# Patient Record
Sex: Female | Born: 1955
Health system: Southern US, Community
[De-identification: ages and names within clinical notes are randomized; demographics above are authoritative.]

## PROBLEM LIST (undated history)

## (undated) DIAGNOSIS — Z8619 Personal history of other infectious and parasitic diseases: Secondary | ICD-10-CM

## (undated) DIAGNOSIS — K5792 Diverticulitis of intestine, part unspecified, without perforation or abscess without bleeding: Secondary | ICD-10-CM

## (undated) DIAGNOSIS — K219 Gastro-esophageal reflux disease without esophagitis: Secondary | ICD-10-CM

## (undated) DIAGNOSIS — Z86010 Personal history of colonic polyps: Secondary | ICD-10-CM

## (undated) DIAGNOSIS — K635 Polyp of colon: Secondary | ICD-10-CM

## (undated) DIAGNOSIS — J189 Pneumonia, unspecified organism: Secondary | ICD-10-CM

## (undated) DIAGNOSIS — T7840XA Allergy, unspecified, initial encounter: Secondary | ICD-10-CM

## (undated) DIAGNOSIS — B351 Tinea unguium: Secondary | ICD-10-CM

## (undated) DIAGNOSIS — B019 Varicella without complication: Secondary | ICD-10-CM

## (undated) HISTORY — DX: Allergy, unspecified, initial encounter: T78.40XA

## (undated) HISTORY — PX: OTHER SURGICAL HISTORY: SHX169

## (undated) HISTORY — PX: COLONOSCOPY: SHX174

## (undated) HISTORY — DX: Personal history of colonic polyps: Z86.010

## (undated) HISTORY — DX: Diverticulitis of intestine, part unspecified, without perforation or abscess without bleeding: K57.92

## (undated) HISTORY — DX: Varicella without complication: B01.9

## (undated) HISTORY — PX: BREAST BIOPSY: SHX20

## (undated) HISTORY — PX: APPENDECTOMY: SHX54

## (undated) HISTORY — DX: Polyp of colon: K63.5

## (undated) HISTORY — DX: Gastro-esophageal reflux disease without esophagitis: K21.9

## (undated) HISTORY — DX: Personal history of other infectious and parasitic diseases: Z86.19

## (undated) HISTORY — PX: ABDOMINAL HYSTERECTOMY: SHX81

---

## 1999-10-30 ENCOUNTER — Encounter: Payer: Self-pay | Admitting: Obstetrics and Gynecology

## 1999-10-30 ENCOUNTER — Encounter: Admission: RE | Admit: 1999-10-30 | Discharge: 1999-10-30 | Payer: Self-pay | Admitting: Obstetrics and Gynecology

## 1999-11-18 ENCOUNTER — Other Ambulatory Visit: Admission: RE | Admit: 1999-11-18 | Discharge: 1999-11-18 | Payer: Self-pay | Admitting: Obstetrics and Gynecology

## 2001-05-30 ENCOUNTER — Encounter: Admission: RE | Admit: 2001-05-30 | Discharge: 2001-05-30 | Payer: Self-pay | Admitting: Obstetrics and Gynecology

## 2001-05-30 ENCOUNTER — Encounter: Payer: Self-pay | Admitting: Obstetrics and Gynecology

## 2001-07-01 ENCOUNTER — Other Ambulatory Visit: Admission: RE | Admit: 2001-07-01 | Discharge: 2001-07-01 | Payer: Self-pay | Admitting: Obstetrics and Gynecology

## 2002-06-05 ENCOUNTER — Encounter: Payer: Self-pay | Admitting: Obstetrics and Gynecology

## 2002-06-05 ENCOUNTER — Encounter: Admission: RE | Admit: 2002-06-05 | Discharge: 2002-06-05 | Payer: Self-pay | Admitting: Obstetrics and Gynecology

## 2002-07-03 ENCOUNTER — Other Ambulatory Visit: Admission: RE | Admit: 2002-07-03 | Discharge: 2002-07-03 | Payer: Self-pay | Admitting: Obstetrics and Gynecology

## 2002-07-06 ENCOUNTER — Ambulatory Visit (HOSPITAL_COMMUNITY): Admission: RE | Admit: 2002-07-06 | Discharge: 2002-07-06 | Payer: Self-pay | Admitting: Obstetrics and Gynecology

## 2002-07-06 ENCOUNTER — Encounter: Payer: Self-pay | Admitting: Obstetrics and Gynecology

## 2003-06-08 ENCOUNTER — Encounter: Admission: RE | Admit: 2003-06-08 | Discharge: 2003-06-08 | Payer: Self-pay | Admitting: Obstetrics and Gynecology

## 2003-06-08 ENCOUNTER — Encounter: Payer: Self-pay | Admitting: Obstetrics and Gynecology

## 2003-09-17 ENCOUNTER — Other Ambulatory Visit: Admission: RE | Admit: 2003-09-17 | Discharge: 2003-09-17 | Payer: Self-pay | Admitting: Obstetrics and Gynecology

## 2004-06-09 ENCOUNTER — Encounter: Admission: RE | Admit: 2004-06-09 | Discharge: 2004-06-09 | Payer: Self-pay | Admitting: Obstetrics and Gynecology

## 2004-10-08 ENCOUNTER — Other Ambulatory Visit: Admission: RE | Admit: 2004-10-08 | Discharge: 2004-10-08 | Payer: Self-pay | Admitting: Obstetrics and Gynecology

## 2004-10-14 ENCOUNTER — Encounter: Admission: RE | Admit: 2004-10-14 | Discharge: 2004-10-14 | Payer: Self-pay | Admitting: Obstetrics and Gynecology

## 2005-06-19 ENCOUNTER — Encounter: Admission: RE | Admit: 2005-06-19 | Discharge: 2005-06-19 | Payer: Self-pay | Admitting: Obstetrics and Gynecology

## 2005-06-26 ENCOUNTER — Encounter: Admission: RE | Admit: 2005-06-26 | Discharge: 2005-06-26 | Payer: Self-pay | Admitting: Obstetrics and Gynecology

## 2005-07-06 ENCOUNTER — Encounter: Admission: RE | Admit: 2005-07-06 | Discharge: 2005-07-06 | Payer: Self-pay | Admitting: Obstetrics and Gynecology

## 2005-10-12 ENCOUNTER — Other Ambulatory Visit: Admission: RE | Admit: 2005-10-12 | Discharge: 2005-10-12 | Payer: Self-pay | Admitting: Obstetrics and Gynecology

## 2005-10-21 ENCOUNTER — Encounter: Admission: RE | Admit: 2005-10-21 | Discharge: 2005-10-21 | Payer: Self-pay | Admitting: Obstetrics and Gynecology

## 2006-07-29 ENCOUNTER — Encounter: Admission: RE | Admit: 2006-07-29 | Discharge: 2006-07-29 | Payer: Self-pay | Admitting: Obstetrics and Gynecology

## 2006-08-13 ENCOUNTER — Encounter: Admission: RE | Admit: 2006-08-13 | Discharge: 2006-08-13 | Payer: Self-pay | Admitting: Obstetrics and Gynecology

## 2006-08-16 ENCOUNTER — Encounter (INDEPENDENT_AMBULATORY_CARE_PROVIDER_SITE_OTHER): Payer: Self-pay | Admitting: *Deleted

## 2006-08-16 ENCOUNTER — Encounter: Admission: RE | Admit: 2006-08-16 | Discharge: 2006-08-16 | Payer: Self-pay | Admitting: Obstetrics and Gynecology

## 2006-09-10 ENCOUNTER — Encounter: Admission: RE | Admit: 2006-09-10 | Discharge: 2006-09-10 | Payer: Self-pay | Admitting: Obstetrics and Gynecology

## 2006-09-10 ENCOUNTER — Encounter (INDEPENDENT_AMBULATORY_CARE_PROVIDER_SITE_OTHER): Payer: Self-pay | Admitting: *Deleted

## 2006-09-10 HISTORY — PX: BREAST BIOPSY: SHX20

## 2007-01-31 ENCOUNTER — Encounter: Admission: RE | Admit: 2007-01-31 | Discharge: 2007-01-31 | Payer: Self-pay | Admitting: Obstetrics and Gynecology

## 2007-08-02 ENCOUNTER — Ambulatory Visit: Payer: Self-pay | Admitting: Internal Medicine

## 2007-08-03 ENCOUNTER — Encounter: Admission: RE | Admit: 2007-08-03 | Discharge: 2007-08-03 | Payer: Self-pay | Admitting: Obstetrics and Gynecology

## 2007-08-11 ENCOUNTER — Ambulatory Visit: Payer: Self-pay | Admitting: Internal Medicine

## 2007-08-11 ENCOUNTER — Encounter: Payer: Self-pay | Admitting: Internal Medicine

## 2007-08-19 ENCOUNTER — Encounter: Admission: RE | Admit: 2007-08-19 | Discharge: 2007-08-19 | Payer: Self-pay | Admitting: Obstetrics and Gynecology

## 2007-12-29 ENCOUNTER — Ambulatory Visit (HOSPITAL_COMMUNITY): Admission: RE | Admit: 2007-12-29 | Discharge: 2007-12-29 | Payer: Self-pay | Admitting: Obstetrics and Gynecology

## 2008-03-28 ENCOUNTER — Inpatient Hospital Stay (HOSPITAL_COMMUNITY): Admission: RE | Admit: 2008-03-28 | Discharge: 2008-03-30 | Payer: Self-pay | Admitting: Obstetrics and Gynecology

## 2008-03-28 ENCOUNTER — Encounter (INDEPENDENT_AMBULATORY_CARE_PROVIDER_SITE_OTHER): Payer: Self-pay | Admitting: Obstetrics and Gynecology

## 2008-08-20 ENCOUNTER — Encounter: Admission: RE | Admit: 2008-08-20 | Discharge: 2008-08-20 | Payer: Self-pay | Admitting: Obstetrics and Gynecology

## 2009-09-02 ENCOUNTER — Encounter: Admission: RE | Admit: 2009-09-02 | Discharge: 2009-09-02 | Payer: Self-pay | Admitting: Obstetrics and Gynecology

## 2010-09-08 ENCOUNTER — Encounter: Admission: RE | Admit: 2010-09-08 | Discharge: 2010-09-08 | Payer: Self-pay | Admitting: Obstetrics and Gynecology

## 2010-12-20 ENCOUNTER — Encounter: Payer: Self-pay | Admitting: Obstetrics and Gynecology

## 2010-12-21 ENCOUNTER — Encounter: Payer: Self-pay | Admitting: Obstetrics and Gynecology

## 2011-04-14 NOTE — Op Note (Signed)
[name], [name]                 ACCOUNT NO.:  [id number]   MEDICAL RECORD NO.:  [id number]          PATIENT TYPE:  INP   LOCATION:  9317                          FACILITY:  [facility]   PHYSICIAN:  Huel Cote, M.D. DATE OF BIRTH:  [birth year]   DATE OF PROCEDURE:  03/28/2008  DATE OF DISCHARGE:                               OPERATIVE REPORT   PREOPERATIVE DIAGNOSES:  1. Fibroids.  2. Pelvic pain.  3. Dyspareunia.   POSTOPERATIVE DIAGNOSES:  1. Fibroids.  2. Pelvic pain.  3. Dyspareunia.  4. Pedunculated fibroid intimately associated with the right ovary.   PROCEDURE:  Total abdominal hysterectomy, bilateral salpingo-  oophorectomy.   SURGEON:  Huel Cote, M.D.   ASSISTANT:  Malachi Pro. Ambrose Mantle, M.D.   ANESTHESIA:  General.   FINDINGS:  Frozen was performed secondary to an atypical-appearing  exophytic fibroid, which was intimately associated with the right ovary  and could not be fully differentiated from the ovary.  This was sent and  confirmed by frozen to be an exophytic fibroid with no atypia seen and  the right ovarian tissue appeared normal.  The uterus itself was  approximately 14-16 weeks in size with multiple fibroids noted  throughout.  The left ovary and tube appeared normal.  The abdomen and  pelvis also appeared normal.  Estimated blood loss was 200 mL.  Urine  output 350 mL clear urine.  IV fluids was 2300 mL LR.   OPERATIVE PROCEDURE:  The patient was taken to the operating room where  a general anesthesia was obtained without difficulty.  She was then  prepped and draped in normal sterile fashion in the dorsal supine  position.  A Pfannenstiel skin incision was then made with the Foley  catheter in place and carried through to the underlying layer of fascia  by sharp dissection and Bovie cautery.  Fascia was then opened in the  midline and extended laterally with Mayo scissors.  The inferior aspect  was grasped with Kocher clamps, elevated and  dissected off the rectus  muscles superiorly.  The rectus muscles were dissected off the fascia in  a similar fashion.  The peritoneal cavity itself was entered bluntly and  the peritoneal incision extended both superiorly and inferiorly with  careful attention to avoid both bowel and bladder.  The Alexis self-  retaining large wound retractor was then placed within the incision, and  the patient's bowel packed with moist laparotomy sponges.  The pelvis  and ovaries were inspected carefully with the findings as previously  stated.  As indicated, the right adnexa appeared abnormal with this  intimately associated solid mass so adjacent to the right ovary that it  appeared that they could be confluent.  For this reason, it was decided  that we would perform a frozen section, and I confirmed that this was  indeed simply an exophytic fibroid.  The uterus was grasped at each  cornua with a long Kelly, and the right round ligament was taken down  with Bovie cautery.  The retroperitoneal space was opened slightly and a  window created in the  broad ligament.  The infundibulopelvic ligament  was then identified, grasped with a parametrial clamp and transected  with 2 suture ligatures of 0 Vicryl pop-off placed to secure the pedicle  and no active bleeding noted.  At this point, the utero-ovarian ligament  and stalk that was on this mass was taken in one clamp with a  parametrial clamp, and therefore, the right adnexa in entirety was  amputated with the mass and sent for frozen section.  The utero-ovarian  pedicle was tied off with a 0 Vicryl tie.  Attention was then turned to  the remainder of the uterus.  The left round ligament was then taken  down with Bovie cautery, and likewise, a window made in the broad  ligament.  The infundibulopelvic ligament was taken down with  parametrial clamp and secured with 2 suture ligatures of 0 Vicryl.  At  this point, the further broad ligament was taken down  easily with Bovie  cautery down to the level of the uterine vessels, which were  skeletonized.  A bladder flap was created along the anterior surface of  the cervix and the bladder pushed away easily.  Attention was then  returned to the patient's right where in a similar fashion, the uterine  arteries were exposed and the bladder flap met in the midline.  The  uterine arteries were then taken down bilaterally with a parametrial  clamp and secured with 2 suture ligatures of 0 Vicryl at each step.  Thus, the uterine artery secured, the remainder of the paracervical and  uterosacral ligaments were taken down sequentially with parametrial  clamps, taking the pedicle and 0 pop-off suture ligature utilized to  suture-ligate each area.  The uterosacral ligaments were held in clamps,  and the remainder of sutures cut at the level of the external cervical  os.  The right angle parametrium was taken across the vaginal cuff and  the uterus and cervix were amputated from the vaginal cuff and handed  off to pathology.  The cuff was then secured with 0 Vicryl pop-offs at  each angle and with several other figure-of-8 sutures along the midline  for good hemostasis.  At this point, all appeared hemostatic.  The  abdomen and pelvis were irrigated and no significant active bleeding.  Small areas of oozing were treated with Bovie cautery.  The  infundibulopelvic and round ligaments appeared dry.  The ureters were  then inspected bilaterally and found to be of normal caliber and away  from the surgical field.  At this point, the uterosacral ligaments were  then reapproximated with a suture of 0 Vicryl for added uterosacral  support.  The peritoneum was then reperitonealized just over the vaginal  cuff area, and again, the pelvis irrigated with no active bleeding  noted.  At this point, all instruments and sponges were then removed  from the patient's abdomen, and the patient had a correct sponge count.  The  subfascial planes and rectus muscle were inspected with any areas of  bleeding treated with Bovie cautery.  The rectus muscle was  reapproximated very nicely with no gaping noted, and therefore, the  fascia was closed primarily with a 0 Vicryl in a running fashion.  Subcutaneous tissue was reapproximated with a running suture of 0  Vicryl, and the skin was closed with staples.  Sponge, lap, and needle  counts were correct again x2, and the patient was awakened and then  taken to the recovery room in stable condition.  Huel Cote, M.D.  Electronically Signed     KR/MEDQ  D:  03/28/2008  T:  03/29/2008  Job:  [id number]

## 2011-04-14 NOTE — H&P (Signed)
NAME:  Crystal [name], TRINKA                 ACCOUNT NO.:  [id number]   MEDICAL RECORD NO.:  [id number]          PATIENT TYPE:  AMB   LOCATION:  SDC                           FACILITY:  [facility]   PHYSICIAN:  Huel Cote, M.D. DATE OF BIRTH:  October 31, 1956   DATE OF ADMISSION:  DATE OF DISCHARGE:                              HISTORY & PHYSICAL   Surgery to take place at the [facility] facility at 9 o'clock a.m.   The patient is a 55 year old nulligravida female who is coming in for a  scheduled total abdominal hysterectomy and bilateral salpingo-  oophorectomy for a fibroid uterus, which has caused some increasing  symptoms with dyspareunia and pain, especially on her right side.  She  also feels that there is some excessive pressure on that side.  She has  had radiological studies performed with an ultrasound in January 2009,  which revealed a large fibroid uterus and a large solid mass extending  off the right side of the uterus.  As it could not be definitely  determined whether this was uterine or ovarian in origin, she had an MRI  to confirm, and it did appear to reflect an exophytic fibroid in  consistency.  The right ovary could not be clearly identified  separately, therefore an ovarian process could not be completely  excluded.  However, it was not felt likely by either radiological study.  There was no other ascites or other findings, and it was felt most  likely this was fibroid in nature.  The patient expressed a desire  secondary to her symptoms and the possibility that there could be an  adnexal process to proceed with a hysterectomy.   PAST MEDICAL HISTORY:  Insignificant.   PAST SURGICAL HISTORY:  She had a tubal ligation, oral surgery and a  second surgery unblock her tubes.   PAST OBSTETRICAL HISTORY:  None.   PAST GYN HISTORY:  1. No abnormal Pap smears.  2. History of fibroids.   FAMILY HISTORY:  Significant with breast cancer in her two sisters, both  at the age  of 75 years old.  She does not have any heart disease or  colon cancer in the family.   Mammogram recently was normal in the last year.   MEDICATIONS:  Include only calcium and vitamin D.   ALLERGIES:  SHE HAS NO KNOWN DRUG ALLERGIES.   PHYSICAL EXAMINATION:  VITAL SIGNS:  Her weight is 145 pounds.  Blood  pressure 100/70.  BREASTS:  Normal.  CARDIAC:  Regular rate and rhythm.  LUNGS:  Clear.  ABDOMEN:  Soft and nontender.  PELVIC:  She has a 14-16 weeks size and mobile uterus.  Ovaries were not  palpable secondary to the bulky nature of the uterus.  The vagina has  some atrophic change.  Cervix has no lesions.   The patient was counseled as to the risks and benefits of proceeding  with surgery, including bleeding, infection and possible damage to bowel  and bladder.  We did discuss performing a laparoscopic supracervical  hysterectomy.  However, the patient declined this surgery and wishes  instead to proceed with an abdominal surgery.  She wishes to have both  ovaries removed at the time of surgery and should any an usual pathology  arise secondary to the right adnexa, wishes to have any necessary  procedures performed at the time of surgery.  The patient understands  all risks and benefits and possible delay in recovery should any  complication arise and desires to proceed with surgery      Huel Cote, M.D.  Electronically Signed     KR/MEDQ  D:  03/27/2008  T:  03/27/2008  Job:  [id number]

## 2011-04-17 NOTE — Discharge Summary (Signed)
NAMEIVRY, PIGUE                 ACCOUNT NO.:  [id number]   MEDICAL RECORD NO.:  [id number]          PATIENT TYPE:  INP   LOCATION:  9317                          FACILITY:  [facility]   PHYSICIAN:  Huel Cote, M.D. DATE OF BIRTH:  [birth year]   DATE OF ADMISSION:  03/28/2008  DATE OF DISCHARGE:  03/30/2008                               DISCHARGE SUMMARY   DISCHARGE DIAGNOSES:  1. Fibroids, pelvic pain, and dyspareunia.  2. Status post total abdominal hysterectomy.  3. Pedunculated fibroid intimately associated with the right ovary      with frozen pathology benign.   DISCHARGE MEDICATIONS:  1. Motrin 600 mg p.o. every 6 hours p.r.n.  2. Percocet 1-2 tablets p.o. every 4 hours p.r.n.   FOLLOWUP:  The patient is to follow up in the office in several days for  staple removal.   HOSPITAL COURSE:  The patient is a 55 year old female who came in for a  scheduled total abdominal hysterectomy and bilateral salpingo-  oophorectomy secondary to fibroids and pelvic pain associated with them.  She also had a probable pedunculated fibroid, which was intimately  associated with her right ovary, which had required an MRI to ensure  that it was likely not a solid mass of the ovary; however, given the  patient's symptoms and the inability to 100% assess this, she elected to  have a hysterectomy.  She underwent a total abdominal hysterectomy as  scheduled and was found to have a 14- to- 16-week size uterus with  multiple fibroids noted.  Left ovary and tube were normal.  The right  ovary actually had a very large exophytic fibroid, which was adherent to  it and made it difficult to confirm that it was nonovarian in origin,  therefore this was sent for frozen pathology at the time of hysterectomy  and was thought to be consistent with exophytic fibroid versus solid  ovarian tumor.  Final pathology also confirmed this.  The patient was  then admitted for routine postoperative care.  On postop day  #1, she was  doing well.  She had some soreness, and her hemoglobin was noted to be  11.3.  Later in that day secondary to some increase in her pain, she  received Dilaudid dose with 12.5 of Phenergan.  She became very obtunded  from this dose and did require Narcan, however, she never became  completely unresponsive.  She did get quite out of it and her mental  status was not as clear.  She was observed very closely and anesthesia  actually saw the patient who felt that she was having a reaction to the  Phenergan, more so than Dilaudid and this resolved over several hours as  the medication wore off.  The patient on postop day #2 felt extremely  better.  She had no nausea or vomiting and her pain was controlled.  Her  incision was clear.  Her staples were intact and she was to return in  the office and have those removed.  As she was tolerating regular diet  and doing well, she was felt stable  for discharge home.      Huel Cote, M.D.  Electronically Signed     KR/MEDQ  D:  04/19/2008  T:  04/19/2008  Job:  [id number]

## 2011-08-04 ENCOUNTER — Other Ambulatory Visit: Payer: Self-pay | Admitting: Obstetrics and Gynecology

## 2011-08-04 DIAGNOSIS — Z1231 Encounter for screening mammogram for malignant neoplasm of breast: Secondary | ICD-10-CM

## 2011-08-04 DIAGNOSIS — M8580 Other specified disorders of bone density and structure, unspecified site: Secondary | ICD-10-CM

## 2011-08-25 LAB — CBC
HCT: 32.5 — ABNORMAL LOW
HCT: 40.5
Hemoglobin: 11.3 — ABNORMAL LOW
Hemoglobin: 13.9
MCV: 87.5
MCV: 87.6
Platelets: 253
Platelets: 355
RBC: 4.63
RDW: 14.5
WBC: 4.6

## 2011-08-25 LAB — BASIC METABOLIC PANEL
BUN: 13
Chloride: 104
GFR calc Af Amer: >60
GFR calc non Af Amer: >60
Potassium: 3.8
Sodium: 140

## 2011-09-10 ENCOUNTER — Ambulatory Visit
Admission: RE | Admit: 2011-09-10 | Discharge: 2011-09-10 | Disposition: A | Discharge status: Home / Self Care | Payer: 59 | Source: Ambulatory Visit | Attending: Obstetrics and Gynecology | Admitting: Obstetrics and Gynecology

## 2011-09-10 DIAGNOSIS — M8580 Other specified disorders of bone density and structure, unspecified site: Secondary | ICD-10-CM

## 2011-09-10 DIAGNOSIS — Z1231 Encounter for screening mammogram for malignant neoplasm of breast: Secondary | ICD-10-CM

## 2012-07-27 ENCOUNTER — Emergency Department (INDEPENDENT_AMBULATORY_CARE_PROVIDER_SITE_OTHER): Payer: 59

## 2012-07-27 ENCOUNTER — Encounter: Payer: Self-pay | Admitting: *Deleted

## 2012-07-27 ENCOUNTER — Emergency Department
Admission: EM | Admit: 2012-07-27 | Discharge: 2012-07-27 | Disposition: A | Discharge status: Home / Self Care | Payer: 59 | Source: Home / Self Care | Attending: Family Medicine | Admitting: Family Medicine

## 2012-07-27 DIAGNOSIS — D72819 Decreased white blood cell count, unspecified: Secondary | ICD-10-CM

## 2012-07-27 DIAGNOSIS — R52 Pain, unspecified: Secondary | ICD-10-CM

## 2012-07-27 DIAGNOSIS — J189 Pneumonia, unspecified organism: Secondary | ICD-10-CM

## 2012-07-27 DIAGNOSIS — R918 Other nonspecific abnormal finding of lung field: Secondary | ICD-10-CM

## 2012-07-27 LAB — POCT CBC W AUTO DIFF (K'VILLE URGENT CARE)

## 2012-07-27 LAB — POCT INFLUENZA A/B
Influenza A, POC: NEGATIVE
Influenza B, POC: NEGATIVE

## 2012-07-27 NOTE — ED Notes (Signed)
Pt c/o cough, chest congestion, and nasal congestion x 4 days. She also c/o  chills, body aches, and fever x today. She has taken zyrtec and aleve. She has a hx of pneumonia in 1/13'.

## 2012-07-27 NOTE — ED Notes (Signed)
Called and spoke to charge nurse Amy at [facility] center given report and faxed pts records to her.

## 2012-07-27 NOTE — ED Provider Notes (Signed)
History     CSN: [id number]  Arrival date & time 07/27/12  1536   First MD Initiated Contact with Patient 07/27/12 1608      Chief Complaint  Patient presents with  . Fever  . Generalized Body Aches  . Chills      HPI Comments: Patient complains of approximately 5 day history of gradually progressive URI symptoms beginning with   nasal congestion followed by a cough the next day.  No sore throat.  Complains of fatigue and myalgias.  Cough is now worse at night and generally non-productive during the day.  She became worse today with shortness of breath with activity and tightness in her anterior chest.  No wheezing.  She developed chills and shakes at 11:30 this morning. She has a history of pneumonia in January, 2013.  She subsequently had a pneumococcal vaccination.   The history is provided by the patient and the spouse.    History reviewed. No pertinent past medical history.  Past Surgical History  Procedure Date  . Abdominal hysterectomy   . Dental implant     Family History  Problem Relation Age of Onset  . Heart failure Mother   . Cancer Father     pancreatic  . Cancer Sister     breast    History  Substance Use Topics  . Smoking status: Never Smoker   . Smokeless tobacco: Not on file  . Alcohol Use: Yes    OB History    Grav Para Term Preterm Abortions TAB SAB Ect Mult Living                  Review of Systems No sore throat + cough ? pleuritic pain No wheezing + nasal congestion + post-nasal drainage No sinus pain/pressure No itchy/red eyes No earache No hemoptysis + SOB + fever, + chills No nausea No vomiting No abdominal pain No diarrhea No urinary symptoms No skin rashes + fatigue + myalgias + headache Used OTC meds without relief  Allergies  Other  Home Medications  No current outpatient prescriptions on file.  BP 100/64  Pulse 114  Temp 102.5 F (39.2 C)  Resp 20  Ht 5\' 2"  (1.575 m)  Wt 151 lb 12 oz (68.833 kg)  BMI  27.76 kg/m2  SpO2 94%  Physical Exam Nursing notes and Vital Signs reviewed. Appearance:  Patient appears healthy, stated age, and in no acute distress Eyes:  Pupils are equal, round, and reactive to light and accomodation.  Extraocular movement is intact.  Conjunctivae are not inflamed  Ears:  Canals normal.  Tympanic membranes normal.  Nose:  Mildly congested turbinates.  No sinus tenderness.  Mouth:  moist mucous membranes   Pharynx:  Normal Neck:  Supple.  Tender shotty anterior/posterior nodes are palpated bilaterally  Lungs:  Clear to auscultation.  Breath sounds are equal. No respiratory distress. Chest:  Distinct tenderness to palpation over the mid-sternum.  Heart:  Regular rate and rhythm without murmurs, rubs, or gallops.  Abdomen:  Nontender without masses or hepatosplenomegaly.  Bowel sounds are present.  No CVA or flank tenderness.  Extremities:  No edema.  No calf tenderness Skin:  No rash present.   ED Course  Procedures  none   Labs Reviewed  POCT CBC W AUTO DIFF ([facility])   WBC 1.0; LY 16.5; MO 1.6; GR 81.9; Hgb 12.2; Platelets 240   POCT INFLUENZA A/B negative   Dg Chest 2 View  07/27/2012  *RADIOLOGY REPORT*  Clinical Data: Fever generalized body aches.  CHEST - 2 VIEW  Comparison: None available.  Findings: The heart size is normal.  Ill-defined lower lobe airspace disease is best seen on the lateral view.  The upper lung fields are clear.  Mild degenerative changes are noted in the thoracic spine.  IMPRESSION: Lower lobe airspace disease is best visualized on the lateral view. While this may represent atelectasis, raises concern for early pneumonia.   Original Report Authenticated By: Jamesetta Orleans. MATTERN, M.D.      1. Pneumonia   2. Leukopenia       MDM  With likely bilateral early pneumonia on chest X-ray and leukopenia (WBC 1.0), will refer to [facility] ER for possible admission.        Lattie Haw, MD 07/27/12  [phone]

## 2012-08-05 ENCOUNTER — Ambulatory Visit
Admission: RE | Admit: 2012-08-05 | Discharge: 2012-08-05 | Disposition: A | Discharge status: Home / Self Care | Payer: 59 | Source: Ambulatory Visit | Attending: Family Medicine | Admitting: Family Medicine

## 2012-08-05 ENCOUNTER — Other Ambulatory Visit: Payer: Self-pay | Admitting: Family Medicine

## 2012-08-05 VITALS — BP 100/62 | HR 71

## 2012-08-05 DIAGNOSIS — G971 Other reaction to spinal and lumbar puncture: Secondary | ICD-10-CM

## 2012-08-05 MED ORDER — IOHEXOL 180 MG/ML  SOLN
1.0000 mL | Freq: Once | INTRAMUSCULAR | Status: AC | PRN
  Administered 2012-08-05: 1 mL via EPIDURAL

## 2012-08-05 NOTE — Progress Notes (Signed)
20cc blood drawn for procedure from right AC space without difficulty; site unremarkable.  jkl 

## 2012-08-08 ENCOUNTER — Other Ambulatory Visit: Payer: Self-pay | Admitting: Obstetrics and Gynecology

## 2012-08-08 DIAGNOSIS — Z1231 Encounter for screening mammogram for malignant neoplasm of breast: Secondary | ICD-10-CM

## 2012-09-12 ENCOUNTER — Ambulatory Visit
Admission: RE | Admit: 2012-09-12 | Discharge: 2012-09-12 | Disposition: A | Discharge status: Home / Self Care | Payer: 59 | Source: Ambulatory Visit | Attending: Obstetrics and Gynecology | Admitting: Obstetrics and Gynecology

## 2012-09-12 DIAGNOSIS — Z1231 Encounter for screening mammogram for malignant neoplasm of breast: Secondary | ICD-10-CM

## 2012-09-16 ENCOUNTER — Other Ambulatory Visit: Payer: Self-pay | Admitting: Obstetrics and Gynecology

## 2012-09-16 DIAGNOSIS — R928 Other abnormal and inconclusive findings on diagnostic imaging of breast: Secondary | ICD-10-CM

## 2012-09-20 ENCOUNTER — Ambulatory Visit
Admission: RE | Admit: 2012-09-20 | Discharge: 2012-09-20 | Disposition: A | Discharge status: Home / Self Care | Payer: 59 | Source: Ambulatory Visit | Attending: Obstetrics and Gynecology | Admitting: Obstetrics and Gynecology

## 2012-09-20 ENCOUNTER — Other Ambulatory Visit: Payer: Self-pay | Admitting: Obstetrics and Gynecology

## 2012-09-20 DIAGNOSIS — R928 Other abnormal and inconclusive findings on diagnostic imaging of breast: Secondary | ICD-10-CM

## 2012-09-30 ENCOUNTER — Other Ambulatory Visit: Payer: Self-pay | Admitting: Obstetrics and Gynecology

## 2012-09-30 ENCOUNTER — Ambulatory Visit
Admission: RE | Admit: 2012-09-30 | Discharge: 2012-09-30 | Disposition: A | Discharge status: Home / Self Care | Payer: 59 | Source: Ambulatory Visit | Attending: Obstetrics and Gynecology | Admitting: Obstetrics and Gynecology

## 2012-09-30 DIAGNOSIS — R928 Other abnormal and inconclusive findings on diagnostic imaging of breast: Secondary | ICD-10-CM

## 2012-11-14 ENCOUNTER — Encounter: Payer: Self-pay | Admitting: *Deleted

## 2012-11-14 ENCOUNTER — Emergency Department (INDEPENDENT_AMBULATORY_CARE_PROVIDER_SITE_OTHER): Payer: 59

## 2012-11-14 ENCOUNTER — Emergency Department
Admission: EM | Admit: 2012-11-14 | Discharge: 2012-11-14 | Disposition: A | Discharge status: Home / Self Care | Payer: 59 | Source: Home / Self Care | Attending: Family Medicine | Admitting: Family Medicine

## 2012-11-14 DIAGNOSIS — R05 Cough: Secondary | ICD-10-CM

## 2012-11-14 DIAGNOSIS — R059 Cough, unspecified: Secondary | ICD-10-CM

## 2012-11-14 DIAGNOSIS — J069 Acute upper respiratory infection, unspecified: Secondary | ICD-10-CM

## 2012-11-14 DIAGNOSIS — D72819 Decreased white blood cell count, unspecified: Secondary | ICD-10-CM

## 2012-11-14 DIAGNOSIS — R509 Fever, unspecified: Secondary | ICD-10-CM

## 2012-11-14 DIAGNOSIS — J8410 Pulmonary fibrosis, unspecified: Secondary | ICD-10-CM

## 2012-11-14 LAB — POCT CBC W AUTO DIFF (K'VILLE URGENT CARE)

## 2012-11-14 LAB — POCT INFLUENZA A/B: Influenza A, POC: NEGATIVE

## 2012-11-14 MED ORDER — CEFTRIAXONE SODIUM 1 G IJ SOLR
1.0000 g | Freq: Once | INTRAMUSCULAR | Status: AC
  Administered 2012-11-14: 1 g via INTRAMUSCULAR

## 2012-11-14 MED ORDER — ACETAMINOPHEN 325 MG PO TABS
650.0000 mg | ORAL_TABLET | Freq: Once | ORAL | Status: AC
  Administered 2012-11-14: 650 mg via ORAL

## 2012-11-14 MED ORDER — ONDANSETRON HCL 4 MG PO TABS: 4.0000 mg | ORAL_TABLET | Freq: Four times a day (QID) | ORAL | Status: DC

## 2012-11-14 MED ORDER — ONDANSETRON 4 MG PO TBDP
4.0000 mg | ORAL_TABLET | Freq: Once | ORAL | Status: AC
  Administered 2012-11-14: 4 mg via ORAL

## 2012-11-14 MED ORDER — CEFDINIR 300 MG PO CAPS: 300.0000 mg | ORAL_CAPSULE | Freq: Two times a day (BID) | ORAL | Status: DC

## 2012-11-14 MED ORDER — BENZONATATE 200 MG PO CAPS: 200.0000 mg | ORAL_CAPSULE | Freq: Every day | ORAL | Status: DC

## 2012-11-14 NOTE — ED Provider Notes (Signed)
History     CSN: [id number]  Arrival date & time 11/14/12  1711   First MD Initiated Contact with Patient 11/14/12 1738      Chief Complaint  Patient presents with  . Emesis  . Fever  . Generalized Body Aches  . Chills      HPI Comments: Patient complains of 3 day history of flu-like symptoms beginning with a mild sore throat (now improved), fatigue, myalgias, headache, chills, sinus congestion, and a mild cough.  This afternoon she developed nausea and had several episodes of vomiting.  Cough is generally non-productive. There has been no pleuritic pain, shortness of breath, or wheezes but she does have a sensation of tightness in her anterior chest.  She has had a seasonal flu shot and pneumococcal vaccine.  She has had two episodes of pneumonia this year, most recently in August when she was hospitalized for a week.  The history is provided by the patient and the spouse.    History reviewed.  Two episodes pneumonia this year.  Past Surgical History  Procedure Date  . Abdominal hysterectomy   . Dental implant     Family History  Problem Relation Age of Onset  . Heart failure Mother   . Cancer Father     pancreatic  . Cancer Sister     breast    History  Substance Use Topics  . Smoking status: Never Smoker   . Smokeless tobacco: Not on file  . Alcohol Use: Yes    OB History    Grav Para Term Preterm Abortions TAB SAB Ect Mult Living                  Review of Systems + sore throat + cough No pleuritic pain No wheezing + nasal congestion + post-nasal drainage No sinus pain/pressure No itchy/red eyes No earache No hemoptysis No SOB + fever, + chills + nausea + vomiting, resolved No abdominal pain No diarrhea No urinary symptoms No skin rashes + fatigue + myalgias + headache Used OTC meds without relief  Allergies  Demerol and Phenergan  Home Medications   Current Outpatient Rx  Name  Route  Sig  Dispense  Refill  . BENZONATATE 200 MG  PO CAPS   Oral   Take 1 capsule (200 mg total) by mouth at bedtime. Take as needed for cough   12 capsule   0   . CEFDINIR 300 MG PO CAPS   Oral   Take 1 capsule (300 mg total) by mouth 2 (two) times daily.   20 capsule   0   . ONDANSETRON HCL 4 MG PO TABS   Oral   Take 1 tablet (4 mg total) by mouth every 6 (six) hours. as needed for nausea   12 tablet   0     BP 105/66  Pulse 95  Temp 102 F (38.9 C) (Tympanic)  Resp 18  Wt 155 lb (70.308 kg)  SpO2 96%  Physical Exam Nursing notes and Vital Signs reviewed. Appearance:  Patient appears stated age, and in no acute distress Eyes:  Pupils are equal, round, and reactive to light and accomodation.  Extraocular movement is intact.  Conjunctivae are not inflamed  Ears:  Canals normal.  Tympanic membranes normal.  Nose:  Mildly congested turbinates.  No sinus tenderness.   Pharynx:  Normal Neck:  Supple.  Slightly tender shotty posterior nodes are palpated bilaterally  Lungs:  Clear to auscultation.  Breath sounds are equal.  Chest:  Distinct tenderness to palpation over the mid-sternum.  Heart:  Regular rate and rhythm without murmurs, rubs, or gallops.  Abdomen:  Nontender without masses or hepatosplenomegaly.  Bowel sounds are present.  No CVA or flank tenderness.  Extremities:  No edema.  No calf tenderness Skin:  No rash present.   ED Course  Procedures none   Labs Reviewed  POCT INFLUENZA A/B negative  POCT CBC W AUTO DIFF ([facility]):  WBC 2.5; LY 13.7; MO 5.6; GR 80.7; Hgb 12.9; Platelets 265    Dg Chest 2 View  11/14/2012  *RADIOLOGY REPORT*  Clinical Data: Cough and fever.  CHEST - 2 VIEW  Comparison: None.  Findings: Normal sized heart.  Clear lungs.  The interstitial markings are mildly prominent.  Thoracic spine degenerative changes.  IMPRESSION: Mild interstitial lung disease, most likely chronic.   Original Report Authenticated By: Beckie Salts, M.D.      1. Acute upper respiratory infections  of unspecified site   2. Leukopenia       MDM  Chart reviewed; with a history of pneumonia twice this year (last episode August), and leukopenia will give Rocephin 1mg  IM.  Zofran 4mg  ODT for nausea  Tomorrow begin Omnicef 300mg .  Rx for Zofran.  Prescription written for Benzonatate [facility]) to take at bedtime for night-time cough.  Take Mucinex D (guaifenesin with decongestant), or plain Mucinex, twice daily for congestion.  Increase fluid intake, rest. May use Afrin nasal spray (or generic oxymetazoline) twice daily for about 5 days.  Also recommend using saline nasal spray several times daily and saline nasal irrigation (AYR is a common brand) Stop all antihistamines for now, and other non-prescription cough/cold preparations. May take Ibuprofen 200mg , 4 tabs every 8 hours with food for fever, body aches, headache, etc. Followup with Family Doctor in 3 days.  Return earlier for worsening symptoms.        Lattie Haw, MD 11/14/12 [phone]

## 2012-11-14 NOTE — ED Notes (Signed)
Pt c/o chills, fever, body aches, HA and vomiting x 2 hours ago. She reports having pneumonia x 2 this year. She also c/o hoarseness and cough x 3 days.

## 2012-11-18 ENCOUNTER — Telehealth: Payer: Self-pay | Admitting: *Deleted

## 2012-12-06 ENCOUNTER — Encounter (HOSPITAL_COMMUNITY): Payer: Self-pay | Admitting: *Deleted

## 2012-12-06 ENCOUNTER — Inpatient Hospital Stay (HOSPITAL_COMMUNITY)
Admission: EM | Admit: 2012-12-06 | Discharge: 2012-12-10 | DRG: 193 | Disposition: A | Discharge status: Home / Self Care | Payer: 59 | Attending: Family Medicine | Admitting: Family Medicine

## 2012-12-06 DIAGNOSIS — R109 Unspecified abdominal pain: Secondary | ICD-10-CM

## 2012-12-06 DIAGNOSIS — R509 Fever, unspecified: Secondary | ICD-10-CM

## 2012-12-06 DIAGNOSIS — J9600 Acute respiratory failure, unspecified whether with hypoxia or hypercapnia: Secondary | ICD-10-CM | POA: Diagnosis present

## 2012-12-06 DIAGNOSIS — I959 Hypotension, unspecified: Secondary | ICD-10-CM | POA: Diagnosis present

## 2012-12-06 DIAGNOSIS — J189 Pneumonia, unspecified organism: Principal | ICD-10-CM | POA: Diagnosis present

## 2012-12-06 DIAGNOSIS — J90 Pleural effusion, not elsewhere classified: Secondary | ICD-10-CM

## 2012-12-06 DIAGNOSIS — D649 Anemia, unspecified: Secondary | ICD-10-CM | POA: Diagnosis present

## 2012-12-06 DIAGNOSIS — R51 Headache: Secondary | ICD-10-CM | POA: Diagnosis present

## 2012-12-06 DIAGNOSIS — E876 Hypokalemia: Secondary | ICD-10-CM | POA: Diagnosis present

## 2012-12-06 DIAGNOSIS — R519 Headache, unspecified: Secondary | ICD-10-CM | POA: Diagnosis present

## 2012-12-06 DIAGNOSIS — R1110 Vomiting, unspecified: Secondary | ICD-10-CM | POA: Diagnosis present

## 2012-12-06 DIAGNOSIS — R918 Other nonspecific abnormal finding of lung field: Secondary | ICD-10-CM

## 2012-12-06 DIAGNOSIS — J9601 Acute respiratory failure with hypoxia: Secondary | ICD-10-CM | POA: Diagnosis not present

## 2012-12-06 DIAGNOSIS — R0902 Hypoxemia: Secondary | ICD-10-CM

## 2012-12-06 DIAGNOSIS — R112 Nausea with vomiting, unspecified: Secondary | ICD-10-CM | POA: Diagnosis present

## 2012-12-06 HISTORY — DX: Pneumonia, unspecified organism: J18.9

## 2012-12-06 LAB — CBC WITH DIFFERENTIAL/PLATELET
Eosinophils Absolute: 0 10*3/uL (ref 0.0–0.7)
Eosinophils Relative: 0 % (ref 0–5)
Hemoglobin: 13.9 g/dL (ref 12.0–15.0)
Lymphs Abs: 0.2 10*3/uL — ABNORMAL LOW (ref 0.7–4.0)
MCH: 29.5 pg (ref 26.0–34.0)
MCV: 87.5 fL (ref 78.0–100.0)
Monocytes Relative: 0 % — ABNORMAL LOW (ref 3–12)
Platelets: 276 10*3/uL (ref 150–400)
RBC: 4.71 MIL/uL (ref 3.87–5.11)

## 2012-12-06 LAB — URINE MICROSCOPIC-ADD ON

## 2012-12-06 LAB — COMPREHENSIVE METABOLIC PANEL
BUN: 18 mg/dL (ref 6–23)
Calcium: 9.5 mg/dL (ref 8.4–10.5)
GFR calc Af Amer: 78 mL/min — ABNORMAL LOW (ref >90)
Glucose, Bld: 92 mg/dL (ref 70–99)
Total Protein: 7.1 g/dL (ref 6.0–8.3)

## 2012-12-06 LAB — URINALYSIS, ROUTINE W REFLEX MICROSCOPIC
Ketones, ur: 15 mg/dL — AB
Nitrite: NEGATIVE
Specific Gravity, Urine: 1.025 (ref 1.005–1.030)
pH: 5 (ref 5.0–8.0)

## 2012-12-06 NOTE — ED Notes (Signed)
The pt is c/o a headache since  1630 with n and vomiting.  She was given something for nausea at work .  She is hurting all over

## 2012-12-07 ENCOUNTER — Emergency Department (HOSPITAL_COMMUNITY): Payer: 59

## 2012-12-07 ENCOUNTER — Encounter (HOSPITAL_COMMUNITY): Payer: Self-pay | Admitting: Radiology

## 2012-12-07 ENCOUNTER — Inpatient Hospital Stay (HOSPITAL_COMMUNITY): Payer: 59

## 2012-12-07 DIAGNOSIS — R918 Other nonspecific abnormal finding of lung field: Secondary | ICD-10-CM

## 2012-12-07 DIAGNOSIS — J9600 Acute respiratory failure, unspecified whether with hypoxia or hypercapnia: Secondary | ICD-10-CM

## 2012-12-07 DIAGNOSIS — R509 Fever, unspecified: Secondary | ICD-10-CM

## 2012-12-07 DIAGNOSIS — J9601 Acute respiratory failure with hypoxia: Secondary | ICD-10-CM

## 2012-12-07 DIAGNOSIS — J189 Pneumonia, unspecified organism: Principal | ICD-10-CM

## 2012-12-07 DIAGNOSIS — R51 Headache: Secondary | ICD-10-CM | POA: Diagnosis present

## 2012-12-07 DIAGNOSIS — I517 Cardiomegaly: Secondary | ICD-10-CM

## 2012-12-07 DIAGNOSIS — I959 Hypotension, unspecified: Secondary | ICD-10-CM | POA: Diagnosis present

## 2012-12-07 DIAGNOSIS — R519 Headache, unspecified: Secondary | ICD-10-CM | POA: Diagnosis present

## 2012-12-07 DIAGNOSIS — J90 Pleural effusion, not elsewhere classified: Secondary | ICD-10-CM

## 2012-12-07 DIAGNOSIS — R1110 Vomiting, unspecified: Secondary | ICD-10-CM | POA: Diagnosis present

## 2012-12-07 HISTORY — DX: Pleural effusion, not elsewhere classified: J90

## 2012-12-07 HISTORY — DX: Acute respiratory failure with hypoxia: J96.01

## 2012-12-07 LAB — LACTIC ACID, PLASMA: Lactic Acid, Venous: 1 mmol/L (ref 0.5–2.2)

## 2012-12-07 LAB — INFLUENZA PANEL BY PCR (TYPE A & B)
Influenza A By PCR: NEGATIVE
Influenza B By PCR: NEGATIVE

## 2012-12-07 LAB — CSF CELL COUNT WITH DIFFERENTIAL
RBC Count, CSF: 7 /mm3 — ABNORMAL HIGH
Tube #: 1
WBC, CSF: 0 /mm3 (ref 0–5)

## 2012-12-07 LAB — CORTISOL: Cortisol, Plasma: 2.6 ug/dL

## 2012-12-07 LAB — POCT I-STAT TROPONIN I: Troponin i, poc: 0 ng/mL (ref 0.00–0.08)

## 2012-12-07 LAB — GRAM STAIN

## 2012-12-07 LAB — CBC
HCT: 33.7 % — ABNORMAL LOW (ref 36.0–46.0)
MCHC: 33.5 g/dL (ref 30.0–36.0)
MCV: 87.3 fL (ref 78.0–100.0)
RDW: 14.7 % (ref 11.5–15.5)

## 2012-12-07 LAB — CREATININE, SERUM: GFR calc non Af Amer: >90 mL/min (ref >90)

## 2012-12-07 LAB — PROCALCITONIN: Procalcitonin: 23.48 ng/mL

## 2012-12-07 LAB — PRO B NATRIURETIC PEPTIDE: Pro B Natriuretic peptide (BNP): 534.7 pg/mL — ABNORMAL HIGH (ref 0–125)

## 2012-12-07 MED ORDER — DEXTROSE 5 % IV SOLN
1.0000 g | Freq: Once | INTRAVENOUS | Status: AC
  Administered 2012-12-07: 1 g via INTRAVENOUS
  Filled 2012-12-07: qty 1

## 2012-12-07 MED ORDER — VANCOMYCIN HCL IN DEXTROSE 1-5 GM/200ML-% IV SOLN
1000.0000 mg | Freq: Once | INTRAVENOUS | Status: AC
  Administered 2012-12-07: 1000 mg via INTRAVENOUS
  Filled 2012-12-07: qty 200

## 2012-12-07 MED ORDER — ENOXAPARIN SODIUM 40 MG/0.4ML ~~LOC~~ SOLN
40.0000 mg | Freq: Every day | SUBCUTANEOUS | Status: DC
  Filled 2012-12-07: qty 0.4

## 2012-12-07 MED ORDER — LACTATED RINGERS IV BOLUS (SEPSIS)
2000.0000 mL | Freq: Once | INTRAVENOUS | Status: AC
  Administered 2012-12-07: 2000 mL via INTRAVENOUS

## 2012-12-07 MED ORDER — ONDANSETRON HCL 4 MG PO TABS: 4.0000 mg | ORAL_TABLET | Freq: Four times a day (QID) | ORAL | Status: DC | PRN

## 2012-12-07 MED ORDER — DEXTROSE 5 % IV SOLN
1.0000 g | Freq: Three times a day (TID) | INTRAVENOUS | Status: DC
  Administered 2012-12-07 – 2012-12-09 (×6): 1 g via INTRAVENOUS
  Filled 2012-12-07 (×7): qty 1

## 2012-12-07 MED ORDER — OSELTAMIVIR PHOSPHATE 75 MG PO CAPS
75.0000 mg | ORAL_CAPSULE | Freq: Two times a day (BID) | ORAL | Status: DC
  Administered 2012-12-07: 75 mg via ORAL
  Filled 2012-12-07 (×2): qty 1

## 2012-12-07 MED ORDER — SODIUM CHLORIDE 0.9 % IV SOLN: 1000.0000 mL | INTRAVENOUS | Status: DC

## 2012-12-07 MED ORDER — LACTATED RINGERS IV BOLUS (SEPSIS)
2000.0000 mL | Freq: Once | INTRAVENOUS | Status: AC
  Administered 2012-12-07 (×2): 1000 mL via INTRAVENOUS

## 2012-12-07 MED ORDER — ONDANSETRON HCL 4 MG/2ML IJ SOLN
4.0000 mg | Freq: Once | INTRAMUSCULAR | Status: AC
  Administered 2012-12-07: 4 mg via INTRAVENOUS
  Filled 2012-12-07: qty 2

## 2012-12-07 MED ORDER — SODIUM CHLORIDE 0.9 % IV SOLN
1000.0000 mL | Freq: Once | INTRAVENOUS | Status: AC
  Administered 2012-12-07: 1000 mL via INTRAVENOUS

## 2012-12-07 MED ORDER — DEXAMETHASONE SODIUM PHOSPHATE 10 MG/ML IJ SOLN
10.0000 mg | Freq: Once | INTRAMUSCULAR | Status: AC
  Administered 2012-12-07: 10 mg via INTRAVENOUS
  Filled 2012-12-07: qty 1

## 2012-12-07 MED ORDER — KETOROLAC TROMETHAMINE 30 MG/ML IJ SOLN
15.0000 mg | Freq: Once | INTRAMUSCULAR | Status: AC
  Administered 2012-12-07: 15 mg via INTRAVENOUS
  Filled 2012-12-07: qty 1

## 2012-12-07 MED ORDER — ACETAMINOPHEN 650 MG RE SUPP: 650.0000 mg | Freq: Four times a day (QID) | RECTAL | Status: DC | PRN

## 2012-12-07 MED ORDER — ONDANSETRON HCL 4 MG/2ML IJ SOLN
4.0000 mg | Freq: Four times a day (QID) | INTRAMUSCULAR | Status: DC | PRN
  Administered 2012-12-08: 4 mg via INTRAVENOUS
  Filled 2012-12-07 (×2): qty 2

## 2012-12-07 MED ORDER — SODIUM CHLORIDE 0.9 % IJ SOLN
3.0000 mL | Freq: Two times a day (BID) | INTRAMUSCULAR | Status: DC
  Administered 2012-12-09 – 2012-12-10 (×3): 3 mL via INTRAVENOUS

## 2012-12-07 MED ORDER — HYDROMORPHONE HCL PF 1 MG/ML IJ SOLN
0.5000 mg | Freq: Once | INTRAMUSCULAR | Status: AC
  Administered 2012-12-07: 0.5 mg via INTRAVENOUS
  Filled 2012-12-07: qty 1

## 2012-12-07 MED ORDER — SODIUM CHLORIDE 0.9 % IV SOLN
INTRAVENOUS | Status: DC
  Administered 2012-12-07 – 2012-12-08 (×2): via INTRAVENOUS

## 2012-12-07 MED ORDER — ACETAMINOPHEN 325 MG PO TABS
650.0000 mg | ORAL_TABLET | Freq: Four times a day (QID) | ORAL | Status: DC | PRN
  Administered 2012-12-07 – 2012-12-10 (×8): 650 mg via ORAL
  Filled 2012-12-07 (×8): qty 2

## 2012-12-07 MED ORDER — HYDROMORPHONE HCL PF 1 MG/ML IJ SOLN
1.0000 mg | Freq: Once | INTRAMUSCULAR | Status: AC
  Administered 2012-12-07: 0.5 mg via INTRAVENOUS
  Filled 2012-12-07: qty 1

## 2012-12-07 MED ORDER — ALBUTEROL SULFATE (5 MG/ML) 0.5% IN NEBU: 2.5000 mg | INHALATION_SOLUTION | RESPIRATORY_TRACT | Status: DC | PRN

## 2012-12-07 MED ORDER — VANCOMYCIN HCL 1000 MG IV SOLR
750.0000 mg | Freq: Two times a day (BID) | INTRAVENOUS | Status: DC
  Administered 2012-12-08 (×3): 750 mg via INTRAVENOUS
  Filled 2012-12-07 (×5): qty 750

## 2012-12-07 MED ORDER — LEVOFLOXACIN IN D5W 750 MG/150ML IV SOLN
750.0000 mg | INTRAVENOUS | Status: DC
  Administered 2012-12-07 – 2012-12-08 (×2): 750 mg via INTRAVENOUS
  Filled 2012-12-07 (×3): qty 150

## 2012-12-07 MED ORDER — SODIUM CHLORIDE 0.9 % IV SOLN
1000.0000 mL | INTRAVENOUS | Status: DC
  Administered 2012-12-07: 1000 mL via INTRAVENOUS

## 2012-12-07 NOTE — ED Notes (Signed)
Husband's cell phone # [phone] Please call when Pt is moved, Pt has given permission

## 2012-12-07 NOTE — ED Notes (Signed)
MD at bedside. 

## 2012-12-07 NOTE — ED Notes (Signed)
Pt got in wheel chair and out and in bathroom and out good. Pt complained of hurting in stomach pt expained it was sore from throwing up so much last night.7:49am JG.

## 2012-12-07 NOTE — ED Notes (Signed)
Echo at bedside

## 2012-12-07 NOTE — ED Provider Notes (Addendum)
History     CSN: [id number]  Arrival date & time 12/06/12  1932   First MD Initiated Contact with Patient 12/07/12 0013      Chief Complaint  Patient presents with  . Headache    (Consider location/radiation/quality/duration/timing/severity/associated sxs/prior treatment) HPIRose Judie Petit [name] is a 57 y.o. female presents with multiple complaints chief among them his headache, fevers, chills, nausea and vomiting. Patient was walking to work in about 20 minutes to 4:00 when she had sudden onset of a severe headache. This is a sharp headache that started at the back of her head and neck and radiated forward in her head. She does not typically get headaches like this. This is associated with some nausea and about 4:30 vomiting and feeling hot. She is complaining about some moderate right upper quadrant and epigastric pain that is sharp and nonradiating. She's had some intermittent cough as well, no chest pain or shortness of breath. Patient is also complaining about generalized body aches.  Patient is typically healthy however has had 2 instances of pneumonia this year, she also had a lumbar puncture at an outside facility necessitating a blood patch. No history of subarachnoid hemorrhage.  Patient also visited [place] last January and was around mosquito infested areas and did not take any malaria prophylaxis during that time.   History reviewed. No pertinent past medical history.  Past Surgical History  Procedure Date  . Abdominal hysterectomy   . Dental implant     Family History  Problem Relation Age of Onset  . Heart failure Mother   . Cancer Father     pancreatic  . Cancer Sister     breast    History  Substance Use Topics  . Smoking status: Never Smoker   . Smokeless tobacco: Not on file  . Alcohol Use: Yes    OB History    Grav Para Term Preterm Abortions TAB SAB Ect Mult Living                  Review of Systems At least 10pt or greater review of systems completed  and are negative except where specified in the HPI.  Allergies  Demerol and Phenergan  Home Medications   Current Outpatient Rx  Name  Route  Sig  Dispense  Refill  . ONDANSETRON HCL 4 MG PO TABS   Oral   Take 1 tablet (4 mg total) by mouth every 6 (six) hours. as needed for nausea   12 tablet   0     BP 79/47  Pulse 92  Temp 99.4 F (37.4 C) (Oral)  Resp 18  SpO2 93%  Physical Exam  Nursing notes reviewed.  Electronic medical record reviewed. VITAL SIGNS:   Filed Vitals:   12/06/12 2343 12/07/12 0325 12/07/12 0327 12/07/12 0426  BP: 85/57 79/47  75/40  Pulse:   92   Temp: 99.4 F (37.4 C)   99.4 F (37.4 C)  TempSrc: Oral   Oral  Resp: 24  18 18   SpO2: 95%  93% 94%   CONSTITUTIONAL: Awake, oriented, appears non-toxic HENT: Atraumatic, normocephalic, oral mucosa pink and moist, airway patent. Nares patent without drainage. External ears normal. EYES: Conjunctiva clear, EOMI, PERRLA NECK: Trachea midline, non-tender, supple CARDIOVASCULAR: Normal heart rate, Normal rhythm, No murmurs, rubs, gallops PULMONARY/CHEST: Clear to auscultation, no rhonchi, wheezes, or rales. Symmetrical breath sounds. Non-tender. ABDOMINAL: Non-distended, soft, mild tender to palpation in the epigastrium and right upper quadrant - no rebound or guarding.  BS  normal. NEUROLOGIC: Non-focal, moving all four extremities, no gross sensory or motor deficits. EXTREMITIES: No clubbing, cyanosis, or edema SKIN: Warm, Dry, No erythema, No rash  ED Course  LUMBAR PUNCTURE Performed by: Jones Skene Authorized by: Jones Skene Consent: Verbal consent obtained. Written consent obtained. Consent given by: patient Patient identity confirmed: verbally with patient Time out: Immediately prior to procedure a "time out" was called to verify the correct patient, procedure, equipment, support staff and site/side marked as required. Indications: evaluation for infection and evaluation for  subarachnoid hemorrhage Anesthesia: local infiltration Local anesthetic: lidocaine 2% with epinephrine Anesthetic total: 5 ml Patient sedated: no Preparation: Patient was prepped and draped in the usual sterile fashion. Lumbar space: L3-L4 interspace Patient's position: left lateral decubitus Needle gauge: 22 Needle type: spinal needle - Quincke tip Needle length: 3.5 in Number of attempts: 1 Opening pressure: 26 cm H2O Fluid appearance: clear Tubes of fluid: 4 Total volume: 4 ml Post-procedure: site cleaned and adhesive bandage applied Patient tolerance: Patient tolerated the procedure well with no immediate complications.   (including critical care time)  Labs Reviewed  CBC WITH DIFFERENTIAL - Abnormal; Notable for the following:    Neutrophils Relative 94 (*)     Lymphocytes Relative 5 (*)     Lymphs Abs 0.2 (*)     Monocytes Relative 0 (*)     Monocytes Absolute 0.0 (*)     All other components within normal limits  COMPREHENSIVE METABOLIC PANEL - Abnormal; Notable for the following:    AST 93 (*)     ALT 53 (*)     GFR calc non Af Amer 67 (*)     GFR calc Af Amer 78 (*)     All other components within normal limits  URINALYSIS, ROUTINE W REFLEX MICROSCOPIC - Abnormal; Notable for the following:    Ketones, ur 15 (*)     Leukocytes, UA TRACE (*)     All other components within normal limits  URINE MICROSCOPIC-ADD ON - Abnormal; Notable for the following:    Squamous Epithelial / LPF MANY (*)     Bacteria, UA FEW (*)     Casts HYALINE CASTS (*)     All other components within normal limits  LIPASE, BLOOD   Dg Chest 2 View  12/07/2012  *RADIOLOGY REPORT*  Clinical Data: Chest pain and vomiting.  CHEST - 2 VIEW  Comparison: PA and lateral chest 07/27/2012 and 11/14/2012.  Findings: There is patchy left basilar airspace disease. Peribronchial thickening is noted.  Lung volumes are low.  Heart size is normal.  No pneumothorax or pleural effusion.  IMPRESSION: Bronchitic  change.  Airspace disease the left base is likely due to atelectasis in this low-volume chest or possibly early pneumonia.   Original Report Authenticated By: Holley Dexter, M.D.    Ct Head Wo Contrast  12/07/2012  *RADIOLOGY REPORT*  Clinical Data: Headache.  Nausea and vomiting.  CT HEAD WITHOUT CONTRAST  Technique:  Contiguous axial images were obtained from the base of the skull through the vertex without contrast.  Comparison: None.  Findings:   There is no evidence for acute infarction, intracranial hemorrhage, mass lesion, hydrocephalus, or extra-axial fluid.  No atrophy or white matter disease.  Calvarium intact.No acute sinus or mastoid disease.  IMPRESSION: Negative exam.   Original Report Authenticated By: Davonna Belling, M.D.    US Abdomen Complete  12/07/2012  *RADIOLOGY REPORT*  Clinical Data:  Right upper quadrant pain and fever.  COMPLETE ABDOMINAL ULTRASOUND  Comparison:  None.  Findings:  Gallbladder:  No gallstones, gallbladder wall thickening, or pericholecystic fluid.  Common bile duct:  Measures 0.5 cm.  Liver:  No focal lesion identified.  Within normal limits in parenchymal echogenicity.  IVC:  Appears normal.  Pancreas:  No focal abnormality seen.  Spleen:  Measures 7.0 cm and appears normal.  Right Kidney:  Measures 10.6 cm and appears normal.  Left Kidney:  Measures 10.6 cm and appears normal.  Abdominal aorta:  No aneurysm identified.  IMPRESSION: Negative abdominal ultrasound.   Original Report Authenticated By: Holley Dexter, M.D.      1. Severe headache   2. Fever   3. Hypoxia   4. Abdominal  pain, other specified site       MDM  Crystal [name] is a 57 y.o. female presenting with fever, abdominal pain, mild hypoxia. Patient was previously healthy up until your ago and then she started developing recurrent respiratory infections and has been treated for 2 or 3 episodes of pneumonia in this last year.  Patient's presentation is overall very concerning for subarachnoid  hemorrhage I am less concerned for a meningitis however that remains on the differential she is febrile. Patient's abdomen is mildly tender in the epigastrium and right upper quadrant-this could be secondary to vomiting or could be a biliary cause as well. Patient's blood pressure is marginal in her pulse rate is elevated on intake. She said that while she was at work she received some medicine for her fever which was 102.  Treated the patient with fluid boluses, her blood pressures not move it that much, her pulse rate has reduced, she is afebrile-patient says her blood pressure normally runs 70 or 80 systolic. At this point, she is mentating appropriately, I do not think she is in septic shock. Will continue to treat with fluids. UA is unremarkable, CBC is unremarkable-white count is 4.6. CMP is significant only for an AST and ALT that are marginally elevated at 93 and 53. CT of her head is unremarkable-no hemorrhage is seen. No intracranial masses or other intracranial abnormalities.  Discussed performing lumbar puncture with patient-at first she was hesitant do to prior post LP headache as well as requiring a blood patch, on further conversations, the patient agrees to LP. LP performed and went well - it appeared to be clear and colorless.  Discussed patient's case with [facility] for admission.  Will obtain lactate, procalcitonin.    As of this dictation, full CSF analysis is not available, on tube 4, there was one red blood cell found and no white blood cells. She has already received antibiotics and steroids were given prior to antibiotic administration.  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 Patient has never been ill prior to 2013 when she visited believes in January of last year. We'll obtain thick smears for this reason.         Jones Skene, MD 12/07/12 6578  Jones Skene, MD 12/07/12 2303

## 2012-12-07 NOTE — Consult Note (Signed)
PULMONARY  / CRITICAL CARE MEDICINE  Name: Crystal [name] MRN: [id number] DOB: [birth year]    LOS: 1  REFERRING PROVIDER:  ghimire   CHIEF COMPLAINT:  Refractory hypotension   BRIEF PATIENT DESCRIPTION:  57 year old female admitted 1/7 w/ CC: head ache, nausea and vomiting, and low gd fever. Admitted by IM service. Got 6 liters of NS w/ persistent SBP in 80s (lactate:1.6, nml rnl fxn) . PCCM asked to eval. (of note pt states baseline SBP in 80s for years). Developed progressive hypoxia w/ O2 sats as low as 82% while in ER. PCCM asked to eval.   Note: She claims that, ever since she got [place] 2013 she has not been her usual self with frequent infections, treated 3 times for 1 bout of PNA and subsequent poorly defined bacterial processes which she was treated for w/ ABX and improved.   LINES / TUBES:   CULTURES: bcx2 1/8>>> Malaria smear 1/8>>> CSF culture 1/8>>> CSF gm stain 1/8: neg  Epstein barr 1/8>>> rsv 1/8>>> CMV 1/8>>> Influenza panel 1/8>>> Urine strep 1/8>>> Urine legionella 1/8>>>  ANTIBIOTICS: Cefepime 1/8>>> vanc 1/8>>> levaquin 1/8>>> tamiflu 1/8>>>   SIGNIFICANT EVENTS:  CT head 1/7: negative, no acute issues or sinus disease abd [place] 1/7: neg LP 1/8: gm stain neg; glucose: cell ct: clear, 72; protein 34   LEVEL OF CARE:  ICU  PRIMARY SERVICE:  [facility]-->PCCM CONSULTANTS:   CODE STATUS:  Full  DIET:  As tol  DVT Px:  PAS  GI Px:    HISTORY OF PRESENT ILLNESS:   Patient is a 57 year old Caucasian female without any significant past medical history-apart from 3 episodes of pneumonia/URI last year-her last episode of pneumonia/URI was on 11/14/12 for which she was prescribed a 10 day course of Omnicef which she completed around Christmas time. 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 [name] was in her usual state of  health when, when she developed sudden "thunderclap" headache. This happened when she was getting out of her car at work on 1/7. She then proceeded to have numerous episodes of vomiting. 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 She was then sent home where she continued to have vomiting and then started developing myalgias. She also complained of pain in her bilateral abdominal site area-she felt probably occurred after her vomiting. She then proceeded to be evaluated here in the emergency room, during the PM hours of 1/7. A CT of the head done here in the emergency room did not show any acute abnormalities.  A lumbar puncture was done in the emergency room, which showed a WBC of 1 along with a protein of 34 and a glucose of 72.  She was found to be persistently hypotensive with a blood pressure in the 70s to 80s systolic. She completed 6L of IV fluid boluses. - she was started on empiric antibiotics and tamiflu. PCCM was asked to eval. On evaluation the pt did have marked hypoxia on room air when ambulatory off oxygen.  Note-patient did have influenza and pneumococcal vaccination last year.    PAST MEDICAL HISTORY :  History reviewed. No pertinent past medical history. Past Surgical History  Procedure Date  . Abdominal hysterectomy   . Dental implant    Prior to  Admission medications   Medication Sig Start Date End Date Taking? Authorizing Provider  ondansetron (ZOFRAN) 4 MG tablet Take 1 tablet (4 mg total) by mouth every 6 (six) hours. as needed for nausea 11/14/12  Yes Lattie Haw, MD   Allergies  Allergen Reactions  . Demerol (Meperidine) Other (See Comments)    When mixed with Phenergan, leads to severe somnolence and "blue lips."  . Phenergan (Promethazine Hcl) Other (See Comments)    When mixed with Demerol, leads to severe somnolence and "blue lips."    FAMILY HISTORY:  Family History  Problem Relation Age of Onset  .  Heart failure Mother   . Cancer Father     pancreatic  . Cancer Sister     breast   SOCIAL HISTORY:  reports that she has never smoked. She does not have any smokeless tobacco history on file. She reports that she drinks alcohol. She reports that she does not use illicit drugs.  REVIEW OF SYSTEMS:   Constitutional: Negative for fever, chills, weight loss, malaise/fatigue and diaphoresis.  HENT: Negative for hearing loss, ear pain, nosebleeds, congestion, sore throat, neck pain, tinnitus and ear discharge.   Eyes: Negative for blurred vision, double vision, photophobia, pain, discharge and redness.  Respiratory: Negative for cough, hemoptysis, sputum production, shortness of breath, wheezing and stridor.   Cardiovascular: Negative for chest pain, palpitations, orthopnea, claudication, leg swelling and PND.  Gastrointestinal: Negative for heartburn, nausea, vomiting, abdominal pain, diarrhea, constipation, blood in stool and melena.  Genitourinary: Negative for dysuria, urgency, frequency, hematuria and flank pain.  Musculoskeletal: Negative for myalgias, back pain, joint pain and falls.  Skin: Negative for itching and rash.  Neurological: Negative for dizziness, tingling, tremors, sensory change, speech change, focal weakness, seizures, loss of consciousness, weakness and headaches.  Endo/Heme/Allergies: Negative for environmental allergies and polydipsia. Does not bruise/bleed easily.  INTERVAL HISTORY:   VITAL SIGNS: Temp:  [98.5 F (36.9 C)-99.4 F (37.4 C)] 99.4 F (37.4 C) (01/08 0426) Pulse Rate:  [84-123] 86  (01/08 0930) Resp:  [18-26] 26  (01/08 0930) BP: (70-97)/(40-60) 84/51 mmHg (01/08 0930) SpO2:  [84 %-96 %] 95 % (01/08 0930)  PHYSICAL EXAMINATION: General:  Well developed 57 year old female, appears stated age, in no distress.  Neuro:  Awake, oriented, no focal def, head ache improved.  HEENT:  [place], no JVD  Cardiovascular:  rrr Lungs:  Crackles bilateral bases    Abdomen:  Non-tender  Musculoskeletal:  Intact  Skin:  No rashes or bruising    Lab 12/06/12 1957  NA 140  K 3.6  CL 98  CO2 26  BUN 18  CREATININE 0.92  GLUCOSE 92    Lab 12/06/12 1957  HGB 13.9  HCT 41.2  WBC 4.6  PLT 276  No results found for this basename: CKTOTAL:3,CKMB:3,TROPONINI:3 in the last 168 hours  No results found for this basename: PROBNP:3 in the last 168 hours    PCXR: increase L>R infiltrates.   ASSESSMENT / PLAN:  Principal Problem:  *Hypotension Active Problems:  Acute respiratory failure with hypoxia  Pulmonary infiltrate  Headache  Vomiting   Acute hypoxic resp failure w/ progressive bilateral L>R infiltrates. PNA w/ Left effusion. These could represent edema from aggressive volume resuscitation efforts although one would expect her to be able to tolerate fluid challenge. Alternatively consider CAP vs viral PNA or pneumonitis   Plan -send extensive viral screen -agree w/ influenza screen -send U strep and legionella -broad spec abx to cover CAP -  agree w/ tamiflu -check ECHO  -CVP monitoring would be helpful if we end up placing CVL  -may need to tap left effusion if gets bigger.   Hypotension/SIRS/sepsis.  R/o Viral infection Currently no indication of shock, ie: end organ dysfxn such as renal failure, or elevated lactic acid. She has received 6 liters of NS. Her SBP is in low 80s. She reports that she is typically in the mid 80 systolic range @ baseline. Not sure that her current BP actually represents an active problem. She intermittently meets SIRS criteria, so sepsis on diff dx. Not sure what significance her cruise a year ago and subsequent increased reports of infection occurences is playing here.  Plan -repeat lactic acid -cycle CEs -echo -f/u chemistry -agree w/ HIV test   Headache, nausea and vomiting.  Could all represent viral illness. Had LP in ER. This appears negative, her CT head was negative. Lipase was neg.   Plan: Supportive care.    Billy Fischer, MD ; [facility] [phone].  After 5:30 PM or weekends, call [phone]  Pulmonary and Critical Care Medicine [facility] Pager: [phone]  12/07/2012, 11:31 AM

## 2012-12-07 NOTE — ED Notes (Signed)
Lumbar Puncture complete, Pt tolerated well.

## 2012-12-07 NOTE — ED Notes (Signed)
Pt. C/o nausea. 

## 2012-12-07 NOTE — H&P (Addendum)
PATIENT DETAILS Name: Crystal [name] Age: 57 y.o. Sex: female Date of Birth: August 17, 1956 Admit Date: 12/06/2012 ZOX:WRUEA, Doris Cheadle, MD   CHIEF COMPLAINT:  Fever, headache and vomiting since yesterday  HPI: Patient is a 57 year old Caucasian female without any significant past medical history-apart from 3 episodes of pneumonia/URI last year-her last episode of pneumonia/URI was on 11/14/12 for which she was prescribed a 10 day course of Omnicef which she completed around Christmas time. 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 [name] was in her usual state of health when, when she developed sudden "thunderclap" headache. This happened while she was at work at the [employer]. She then proceeded to have numerous episodes of vomiting. 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 She was then sent home where she continued to have vomiting and then started developing myalgias. She also complains of pain in her bilateral abdominal site area-she claims that probably occurred after her vomiting. She then proceeded to be evaluated here in the emergency room, where she was found to be persistently hypotensive with a blood pressure in the 70s to 80s systolic. She currently has finished 5 L of IV fluid boluses, she is currently almost finished with his sixth liter of fluid bolus. A lumbar puncture was done in the emergency room, which showed a WBC of 1 along with a protein of 34 and a glucose of 72. A CT of the head done here in the emergency room did not show any acute abnormalities. I was asked to admit this patient for further evaluation and treatment. - Note-patient did have influenza and pneumococcal vaccination last year.   ALLERGIES:   Allergies  Allergen Reactions  . Demerol (Meperidine) Other (See Comments)    When mixed with  Phenergan, leads to severe somnolence and "blue lips."  . Phenergan (Promethazine Hcl) Other (See Comments)    When mixed with Demerol, leads to severe somnolence and "blue lips."    PAST MEDICAL HISTORY: Hysterectomy  PAST SURGICAL HISTORY: Past Surgical History  Procedure Date  . Abdominal hysterectomy   . Dental implant     MEDICATIONS AT HOME: Prior to Admission medications   Medication Sig Start Date End Date Taking? Authorizing Provider  ondansetron (ZOFRAN) 4 MG tablet Take 1 tablet (4 mg total) by mouth every 6 (six) hours. as needed for nausea 11/14/12  Yes Lattie Haw, MD    FAMILY HISTORY: Family History  Problem Relation Age of Onset  . Heart failure Mother   . Cancer Father     pancreatic  . Cancer Sister     breast    SOCIAL HISTORY:  reports that she has never smoked. She does not have any smokeless tobacco history on file. She reports that she drinks alcohol. She reports that she does not use illicit drugs.  REVIEW OF SYSTEMS:  Constitutional:   No  weight loss, night sweats,   chills, fatigue.  HEENT:    No headaches, Difficulty swallowing,Tooth/dental problems,Sore throat,  No sneezing, itching, ear ache, nasal congestion, post nasal drip,   Cardio-vascular: No chest pain,  Orthopnea, PND, swelling in lower extremities, anasarca, dizziness, palpitations  GI:  No heartburn, indigestion, abdominal pain, nausea, diarrhea, change in  bowel habits, loss of appetite  Resp: No shortness of breath with exertion or at rest.  No excess mucus, no productive cough, No non-productive cough,  No coughing up of blood.No change in color of mucus.No wheezing.No chest wall deformity  Skin:  no rash or lesions.  GU:  no dysuria, change in color of urine, no urgency or frequency.  No flank pain.  Musculoskeletal: No joint pain or swelling.  No decreased range of motion.  No back pain.  Psych: No change in mood or affect. No depression or anxiety.  No  memory loss.   PHYSICAL EXAM: Blood pressure 84/51, pulse 86, temperature 99.4 F (37.4 C), temperature source Oral, resp. rate 26, SpO2 95.00%.  General appearance :Awake, alert, not in any distress. Speech Clear. Face looks slightly flushed HEENT: Atraumatic and Normocephalic, pupils equally reactive to light and accomodation Neck: supple, no JVD. No cervical lymphadenopathy.  Chest:Good air entry bilaterally, no added sounds  CVS: S1 S2 regular, no murmurs.  Abdomen: Bowel sounds present, Non tender and not distended with no gaurding, rigidity or rebound. Extremities: B/L Lower Ext shows no edema, both legs are warm to touch Neurology: Awake alert, and oriented X 3, CN II-XII intact, Non focal Skin:No Rash Wounds:N/A  LABS ON ADMISSION:   [facility] 12/06/12 1957  NA 140  K 3.6  CL 98  CO2 26  GLUCOSE 92  BUN 18  CREATININE 0.92  CALCIUM 9.5  MG --  PHOS --    Basename 12/06/12 1957  AST 93*  ALT 53*  ALKPHOS 59  BILITOT 0.4  PROT 7.1  ALBUMIN 4.1    Basename 12/07/12 0021  LIPASE 44  AMYLASE --    Basename 12/06/12 1957  WBC 4.6  NEUTROABS 4.3  HGB 13.9  HCT 41.2  MCV 87.5  PLT 276   No results found for this basename: CKTOTAL:3,CKMB:3,CKMBINDEX:3,TROPONINI:3 in the last 72 hours No results found for this basename: DDIMER:2 in the last 72 hours No components found with this basename: POCBNP:3   RADIOLOGIC STUDIES ON ADMISSION: Dg Chest 2 View  12/07/2012  *RADIOLOGY REPORT*  Clinical Data: Chest pain and vomiting.  CHEST - 2 VIEW  Comparison: PA and lateral chest 07/27/2012 and 11/14/2012.  Findings: There is patchy left basilar airspace disease. Peribronchial thickening is noted.  Lung volumes are low.  Heart size is normal.  No pneumothorax or pleural effusion.  IMPRESSION: Bronchitic change.  Airspace disease the left base is likely due to atelectasis in this low-volume chest or possibly early pneumonia.   Original Report Authenticated By: Holley Dexter, M.D.    Ct Head Wo Contrast  12/07/2012  *RADIOLOGY REPORT*  Clinical Data: Headache.  Nausea and vomiting.  CT HEAD WITHOUT CONTRAST  Technique:  Contiguous axial images were obtained from the base of the skull through the vertex without contrast.  Comparison: None.  Findings:   There is no evidence for acute infarction, intracranial hemorrhage, mass lesion, hydrocephalus, or extra-axial fluid.  No atrophy or white matter disease.  Calvarium intact.No acute sinus or mastoid disease.  IMPRESSION: Negative exam.   Original Report Authenticated By: Davonna Belling, M.D.    US Abdomen Complete  12/07/2012  *RADIOLOGY REPORT*  Clinical Data:  Right upper quadrant pain and fever.  COMPLETE ABDOMINAL ULTRASOUND  Comparison:  None.  Findings:  Gallbladder:  No gallstones, gallbladder wall thickening, or pericholecystic fluid.  Common bile duct:  Measures 0.5 cm.  Liver:  No focal lesion identified.  Within normal limits in parenchymal echogenicity.  IVC:  Appears normal.  Pancreas:  No focal abnormality seen.  Spleen:  Measures 7.0 cm and appears normal.  Right Kidney:  Measures 10.6 cm and appears normal.  Left Kidney:  Measures 10.6 cm and appears normal.  Abdominal aorta:  No aneurysm identified.  IMPRESSION: Negative abdominal ultrasound.   Original Report Authenticated By: Holley Dexter, M.D.     ASSESSMENT AND PLAN: Present on Admission:  . Hypotension - secondary to shock-probably a combination of sepsis and volume loss from vomiting  - This is unresponsive to fluid boluses at least so far - There is really no clear-cut foci of infection that can explain this persistent and refractory hypotension  to fluids. Lactic acid is normal. Although a chest x-ray is suggestive of? Pneumonia-patient really does not have clinical features that go along with it.  Given her history of fever, vomiting and generalized myalgias-it is possible she has some sort of a viral infection. -Since she is still  hypotensive in spite of IV fluid resuscitation, I have consulted critical care medicine for assistance.  - In the meantime, I will empirically continue the patient on vancomycin and cefepime, I would also place him on Tamiflu for now.  - Blood cultures have been ordered.  - Cortisol level has been ordered.  - Will need to check HIV  . Headache - CT of the head does not show any acute abnormalities, a CSF analysis is not suggestive of meningitis or encephalitis.  - Monitor for now   . Vomiting - Continued IV fluids and as needed antiemetics. Her abdomen is very soft, do not think that has any abdominal pathology responsible for this.  .Possible pneumonia - Continue empiric vancomycin and cefepime. -Check influenza PCR, continue Tamiflu in the interim  Further plan will depend as patient's clinical course evolves and further radiologic and laboratory data become available. Patient will be monitored closely.  DVT Prophylaxis: - Prophylactic Lovenox   Code Status: -Full code  Total time spent for admission equals 45 minutes.  [facility] Pager [phone]  If 7PM-7AM, please contact night-coverage [email or web address] Password [facility] 12/07/2012, 9:58 AM

## 2012-12-07 NOTE — Progress Notes (Signed)
ANTIBIOTIC CONSULT NOTE - INITIAL  Pharmacy Consult for Vancomycin and Cefepime Indication: rule out pneumonia  Allergies  Allergen Reactions  . Demerol (Meperidine) Other (See Comments)    When mixed with Phenergan, leads to severe somnolence and "blue lips."  . Phenergan (Promethazine Hcl) Other (See Comments)    When mixed with Demerol, leads to severe somnolence and "blue lips."    Patient Measurements: Height: 5\' 2"  (157.5 cm) Weight: 148 lb (67.132 kg) IBW/kg (Calculated) : 50.1   Vital Signs: Temp: 99.4 F (37.4 C) (01/08 0426) Temp src: Oral (01/08 0749) BP: 140/94 mmHg (01/08 1400) Pulse Rate: 80  (01/08 1400) Intake/Output from previous day: 01/07 0701 - 01/08 0700 In: 3000 [I.V.:3000] Out: -   Labs:  Basename 12/07/12 1536 12/06/12 1957  WBC 8.5 4.6  HGB 11.3* 13.9  PLT 231 276  LABCREA -- --  CREATININE -- 0.92   Estimated Creatinine Clearance: 59.9 ml/min (by C-G formula based on Cr of 0.92).  Microbiology: Recent Results (from the past 720 hour(s))  GRAM STAIN     Status: Normal   Collection Time   12/07/12  7:38 AM      Component Value Range Status Comment   Specimen Description CSF   Final    Special Requests NO2   Final    Gram Stain     Final    Value: CYTOSPIN SLIDE     NO WBC SEEN     NO ORGANISMS SEEN   Report Status 12/07/2012 FINAL   Final     Medical History: Past Medical History  Diagnosis Date  . Pneumonia    Assessment:   57 year old woman begun on broad-spectrum antibiotics in ED for pneumonia coverage.  Received Vancomycin 1 gram IV at 5am, Maxipime 1 gram IV at 8am, Levaquin 750 mg IV at 1:30pm.  Tamiflu also starting today. Hx frequent upper respiratory infections, recent 10-day course of Omnicef, completed ~11/23/12.  Cultures and viral studies sent.  Stre pneumo antigen negative, LP negative.  Hypotension on presentation, 6 liters of NS given.    Goal of Therapy:  Vancomycin trough level 15-20 mcg/ml Appropriate Cefepime  dose for renal function and infection  Plan:   Continue Vancomycin with 750 mg IV q12hrs;  Cefepime 1 gram IV q8hrs.  Levaquin 750 mg IV q24hrs as ordered.  Tamiflu 75 mg PO BID x 5 days as ordered.  Will follow up renal function, culture data/viral studies and clinical status.  Dennie Fetters, [place] Pager: [phone] 12/07/2012,4:11 PM

## 2012-12-07 NOTE — ED Notes (Signed)
CCNP at bedside.  

## 2012-12-07 NOTE — Progress Notes (Signed)
  Echocardiogram 2D Echocardiogram has been performed.  Georgian Co 12/07/2012, 1:34 PM

## 2012-12-07 NOTE — ED Notes (Signed)
Results shown to Dr. Rulon Abide

## 2012-12-08 ENCOUNTER — Encounter (HOSPITAL_COMMUNITY): Payer: Self-pay

## 2012-12-08 ENCOUNTER — Inpatient Hospital Stay (HOSPITAL_COMMUNITY): Payer: 59

## 2012-12-08 DIAGNOSIS — R1110 Vomiting, unspecified: Secondary | ICD-10-CM

## 2012-12-08 DIAGNOSIS — R109 Unspecified abdominal pain: Secondary | ICD-10-CM

## 2012-12-08 DIAGNOSIS — I959 Hypotension, unspecified: Secondary | ICD-10-CM

## 2012-12-08 DIAGNOSIS — R918 Other nonspecific abnormal finding of lung field: Secondary | ICD-10-CM

## 2012-12-08 LAB — CBC
HCT: 32.1 % — ABNORMAL LOW (ref 36.0–46.0)
MCH: 29 pg (ref 26.0–34.0)
MCV: 87.9 fL (ref 78.0–100.0)
Platelets: 212 10*3/uL (ref 150–400)
RDW: 14.8 % (ref 11.5–15.5)

## 2012-12-08 LAB — COMPREHENSIVE METABOLIC PANEL
AST: 28 U/L (ref 0–37)
Albumin: 2.7 g/dL — ABNORMAL LOW (ref 3.5–5.2)
BUN: 12 mg/dL (ref 6–23)
Calcium: 8.1 mg/dL — ABNORMAL LOW (ref 8.4–10.5)
Creatinine, Ser: 0.62 mg/dL (ref 0.50–1.10)
GFR calc non Af Amer: >90 mL/min (ref >90)

## 2012-12-08 LAB — HIV ANTIBODY (ROUTINE TESTING W REFLEX): HIV: NONREACTIVE

## 2012-12-08 LAB — EPSTEIN-BARR VIRUS VCA, IGG: EBV VCA IgG: 122 U/mL — ABNORMAL HIGH (ref <18.0)

## 2012-12-08 LAB — LEGIONELLA ANTIGEN, URINE

## 2012-12-08 LAB — PROTIME-INR
INR: 1.17 (ref 0.00–1.49)
Prothrombin Time: 14.7 seconds (ref 11.6–15.2)

## 2012-12-08 LAB — PRO B NATRIURETIC PEPTIDE: Pro B Natriuretic peptide (BNP): 1676 pg/mL — ABNORMAL HIGH (ref 0–125)

## 2012-12-08 MED ORDER — POTASSIUM CHLORIDE CRYS ER 20 MEQ PO TBCR
40.0000 meq | EXTENDED_RELEASE_TABLET | Freq: Once | ORAL | Status: AC
  Administered 2012-12-08: 40 meq via ORAL
  Filled 2012-12-08: qty 2

## 2012-12-08 MED ORDER — KETOROLAC TROMETHAMINE 30 MG/ML IJ SOLN
30.0000 mg | Freq: Three times a day (TID) | INTRAMUSCULAR | Status: DC | PRN
  Administered 2012-12-09: 30 mg via INTRAVENOUS
  Filled 2012-12-08: qty 1

## 2012-12-08 MED ORDER — IOHEXOL 300 MG/ML  SOLN
80.0000 mL | Freq: Once | INTRAMUSCULAR | Status: AC | PRN
  Administered 2012-12-08: 80 mL via INTRAVENOUS

## 2012-12-08 MED ORDER — ONDANSETRON HCL 4 MG PO TABS
4.0000 mg | ORAL_TABLET | Freq: Four times a day (QID) | ORAL | Status: DC
  Filled 2012-12-08 (×8): qty 1

## 2012-12-08 MED ORDER — ALPRAZOLAM 0.25 MG PO TABS
0.2500 mg | ORAL_TABLET | Freq: Three times a day (TID) | ORAL | Status: DC | PRN
  Administered 2012-12-08: 0.25 mg via ORAL
  Filled 2012-12-08: qty 1

## 2012-12-08 MED ORDER — ONDANSETRON HCL 4 MG/2ML IJ SOLN
4.0000 mg | Freq: Four times a day (QID) | INTRAMUSCULAR | Status: DC
  Administered 2012-12-08: 4 mg via INTRAVENOUS
  Filled 2012-12-08: qty 2

## 2012-12-08 MED ORDER — KETOROLAC TROMETHAMINE 30 MG/ML IJ SOLN
30.0000 mg | Freq: Once | INTRAMUSCULAR | Status: AC
  Administered 2012-12-08: 30 mg via INTRAVENOUS
  Filled 2012-12-08: qty 1

## 2012-12-08 NOTE — Progress Notes (Signed)
Name: Crystal [name] MRN: [id number] DOB: October 10, 1956 LOS: 2 Referring Provider: Jeoffrey Massed Referring complaint: Refractory Hypotension  PCCM RESIDENT DAILY PROGRESS NOTE  History of Present Illness: Patient is a 57 year old Caucasian female without any significant past medical history-apart from 3 episodes of pneumonia/URI last year-her last episode of pneumonia/URI was on 11/14/12 for which she was prescribed a 10 day course of Omnicef which she completed around Christmas time. She claims that, ever since she got back from a cruise from [place] in January of 2013 she has not been her usual self with frequent infections. [name] was in her usual state of health when, when she developed sudden "thunderclap" headache. This happened when she was getting out of her car at work on 1/7. She then proceeded to have numerous episodes of vomiting. 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 She was then sent home where she continued to have vomiting and then started developing myalgias. She also complained of pain in her bilateral abdominal site area-she felt probably occurred after her vomiting. She then proceeded to be evaluated here in the emergency room, during the PM hours of 1/7. A CT of the head done here in the emergency room did not show any acute abnormalities. A lumbar puncture was done in the emergency room, which showed a WBC of 1 along with a protein of 34 and a glucose of 72. She was found to be persistently hypotensive with a blood pressure in the 70s to 80s systolic. She completed 6L of IV fluid boluses. - she was started on empiric antibiotics and tamiflu. PCCM was asked to eval. On evaluation the pt did have marked hypoxia on room air when ambulatory off oxygen. Note-patient did have influenza and pneumococcal vaccination last year.   Lines / Drains: None  Cultures: bcx2 1/8>>> NGTD x2 Malaria smear 1/8>>>  CSF culture 1/8>>>  NGTD CSF gm stain 1/8: neg  Epstein barr 1/8>>>  rsv 1/8>>>  CMV 1/8>>>  Influenza panel 1/8: Negative Urine strep 1/8: Negative Urine legionella 1/8>>>  Antibiotics: Cefepime 1/8>>>  vanc 1/8>>>  levaquin 1/8>>>  tamiflu 1/8>>>1/8  Tests / Events: CT head 1/7: negative, no acute issues or sinus disease  abd [place] 1/7: neg  LP 1/8: gm stain neg; glucose: cell ct: clear, 72; protein 34   Overnight Events: Has done well overnight.  Breathing good and BP has been holding steady.  Afebrile.   Vital Signs: Temp:  [97.9 F (36.6 C)-99 F (37.2 C)] 97.9 F (36.6 C) (01/09 1250) Pulse Rate:  [70-91] 70  (01/09 0400) Resp:  [20-23] 20  (01/09 0000) BP: (81-140)/(42-94) 82/45 mmHg (01/09 0820) SpO2:  [93 %-98 %] 93 % (01/09 0800) Weight:  [148 lb (67.132 kg)-161 lb (73.029 kg)] 161 lb (73.029 kg) (01/09 0500) I/O last 3 completed shifts: In: 5737.5 [I.V.:5337.5; IV Piggyback:400] Out: 2025 [Urine:2025]  Physical Examination: Vitals reviewed. General: resting in bed, NAD HEENT: PERRL, EOMI, no scleral icterus Cardiac: RRR, no rubs, murmurs or gallops Pulm: mild bronchial breath sounds in the left base.  No wheezes or rales noted.  Abd: soft, nontender, nondistended, BS present Ext: warm and well perfused, no pedal edema Neuro: alert and oriented X3, cranial nerves II-XII grossly intact, strength and sensation to light touch equal in bilateral upper and lower extremities  Labs and Imaging:   Basic Metabolic Panel:  Lab 12/08/12 6962 12/07/12 1536 12/06/12 1957  NA 140 -- 140  K 3.4* -- 3.6  CL 111 -- 98  CO2 22 -- 26  GLUCOSE 103* -- 92  BUN 12 -- 18  CREATININE 0.62 0.67 --  CALCIUM 8.1* -- 9.5  MG -- -- --  PHOS -- -- --   Liver Function Tests:  Lab 12/08/12 0440 12/06/12 1957  AST 28 93*  ALT 31 53*  ALKPHOS 38* 59  BILITOT 0.2* 0.4  PROT 5.3* 7.1  ALBUMIN 2.7* 4.1    Lab 12/07/12 0021  LIPASE 44  AMYLASE --   CBC:  Lab 12/08/12 0440 12/07/12 1536  12/06/12 1957  WBC 7.0 8.5 --  NEUTROABS -- -- 4.3  HGB 10.6* 11.3* --  HCT 32.1* 33.7* --  MCV 87.9 87.3 --  PLT 212 231 --   BNP:  Lab 12/08/12 0440 12/07/12 1137  PROBNP 1676.0* 534.7*   Coagulation:  Lab 12/08/12 0440  LABPROT 14.7  INR 1.17   Urinalysis:  Lab 12/06/12 2020  COLORURINE YELLOW  LABSPEC 1.025  PHURINE 5.0  GLUCOSEU NEGATIVE  HGBUR NEGATIVE  BILIRUBINUR NEGATIVE  KETONESUR 15*  PROTEINUR NEGATIVE  UROBILINOGEN 0.2  NITRITE NEGATIVE  LEUKOCYTESUR TRACE*    Lab 12/08/12 0440 12/07/12 1137 12/07/12 0836 12/07/12 0830  LATICACIDVEN -- 1.0 1.59 --  PROCALCITON 12.48 19.14 -- 23.48   Assessment and Plan: PULMONARY  ASSESSMENT: 1. Acute hypoxic respiratory failure w/ progressive bilateral L>R infiltrates: Negative for influenza today.  Cultures are still pending but most likely represents repeat pneumonia.  Urine strep is negative but legionella still pending.  She continues on broad spectrum antibiotics.  Echo shows a mildly decreased EF at 50-55% that doesn't explain her current problem. Clinically she appears to be improving today. With her elevated procalcitonin this likely represents a bacterial infection which.  Will plan for CT of chest with contrast to evaluate lung parenchyma as well as to evaluate for adenopathy with the recent history of several episodes of pneumonia since her trip to [place].   PLAN:   Continue antibiotics   CARDIOVASCULAR  ASSESSMENT:  1. Hypotension:  She currently has no signs of symptom of septic shock with normal vital signs and no end organ dysfunction.  She states that her normal baseline is with an SBP in the 80s so this may represent her baseline.    PLAN:  Monitor BP and for end organ dysfunction D/C IV fluids today.   RENAL  ASSESSMENT:  Hypokalemia: Repleted today. PLAN:   Continue to monitor electrolytes and adjust as necessary.   GASTROINTESTINAL  ASSESSMENT:  None PLAN:    none  HEMATOLOGIC  ASSESSMENT:   1. Normocytic anemia: Likely dilutional from fluid resuscitation efforts.  2. Question of possible immunodeficiency: With her recent history multiple infection this raises the concern for possible subacute pathogen vs underlying immunodeficiency.  She has no history of recurrent infections in childhood and knows of know family history.    PLAN:  Monitor CBCs.   Follow up with pulmonary medicine after discharge   INFECTIOUS  ASSESSMENT:   1. HCAP: She has had several episodes of pneumonia after travel to [place].  Currently she appears to have an infiltrate in her LLL which would be consistent with pneumonia.  She would likely benefit from screening for immunodeficiency like CVID vs a non-resolving pneumonia from an atypical pathogen.    PLAN:   Check HIV CT scan of chest with contrast Repeat Chest x-ray 6 weeks after clinical resolution   ENDOCRINE  ASSESSMENT:  none   PLAN:  none  NEUROLOGIC  ASSESSMENT:  none PLAN:   None  We will continue to follow tomorrow to follow up on the chest CT results.  She looks good enough today to transfer out to a med surg bed.   PRIBULA,CHRISTOPHER 12/08/2012, 1:25 PM   Billy Fischer, MD ; [facility] [phone].  After 5:30 PM or weekends, call [phone]

## 2012-12-08 NOTE — Progress Notes (Signed)
UR completed 

## 2012-12-08 NOTE — Progress Notes (Addendum)
[facility] PROGRESS NOTE  Crystal [name]:[id number] DOB: 1955/12/06 DOA: 12/06/2012 PCP: Lenora Boys, MD  Brief narrative: Patient is a 57 year old Caucasian female without any significant past medical history-apart from 3 episodes of pneumonia/URI last year-her last episode of pneumonia/URI was on 11/14/12 for which she was prescribed a 10 day course of Omnicef which she completed around Christmas time. 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 Crystal [name] was in her usual state of health when, when she developed sudden "thunderclap" headache. This happened while she was at work at the [employer]. She then proceeded to have numerous episodes of vomiting. 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 She was then sent home where she continued to have vomiting and then started developing myalgias. She also complains of pain in her bilateral abdominal site area-she claims that probably occurred after her vomiting. She then proceeded to be evaluated here in the emergency room, where she was found to be persistently hypotensive with a blood pressure in the 70s to 80s systolic. She currently has finished 5 L of IV fluid boluses, she is currently almost finished with his sixth liter of fluid bolus. A lumbar puncture was done in the emergency room, which showed a WBC of 1 along with a protein of 34 and a glucose of 72. A CT of the head done here in the emergency room did not show any acute abnormalities. I was asked to admit this patient for further evaluation and treatment.  - Note-patient did have influenza and pneumococcal vaccination last year.   Assessment/Plan: Principal Problem:  *Hypotension Per pt this is a normal BP for her- will stop IVF as she has received about 5 liters (including fluids in ER which are not added to I  and O)-  allow her to transfer to med/surg  Active Problems:  Headache/ Vomiting Suspect viral GI etiology vs secondary to respiratory infection- headache improved- still nauseated- change diet to clears and give Zofran around the clock for today.    Pulmonary infiltrate/ Pleural effusion No new cough or dyspnea Multiple resp infections this past year On Vanc/Cefepime and Levaquin- - PCCM ordered CT chest which show multilobar infiltrates but mostly involving LLL-  I suspect infiltrates are due to volume of fluid given- follow for symptoms of pneumonia- if none, would d/c antibiotics.  - follow I and O - will order echo  Code Status: full code Disposition Plan: transfer to med/surg DVT prophylaxis: scds   Consultants:  pccm  Procedures:  none  Antibiotics:  Vanc/ Cefepime/ Levaquin- 1/8  HPI/Subjective: Pt still nauseated- no diarrhea, no cough or dyspnea, not much abd pain- has not tried the solids on her tray yet.   Objective: Filed Vitals:   12/08/12 0500 12/08/12 0800 12/08/12 0820 12/08/12 1250  BP:   82/45   Pulse:      Temp:  98.7 F (37.1 C)  97.9 F (36.6 C)  TempSrc:  Oral  Oral  Resp:      Height:      Weight: 73.029 kg (161 lb)     SpO2:  93%      Intake/Output Summary (Last 24 hours) at 12/08/12 1423 Last data filed at 12/08/12 1247  Gross per 24 hour  Intake 2787.5 ml  Output   1200 ml  Net  1587.5 ml    Exam:   General:  Alert, no distress  Cardiovascular: RRR, no mumurs  Respiratory: crackles in LLL, no ronchi or wheeze  Abdomen: soft, mild diffuse tenderness in upper abdomen  Ext:no c/c/e  Data Reviewed: Basic Metabolic Panel:  Lab 12/08/12 7829 12/07/12 1536 12/06/12 1957  NA 140 -- 140  K 3.4* -- 3.6  CL 111 -- 98  CO2 22 -- 26  GLUCOSE 103* -- 92  BUN 12 -- 18  CREATININE 0.62 0.67 0.92  CALCIUM 8.1* -- 9.5  MG -- -- --  PHOS -- -- --   Liver Function Tests:  Lab 12/08/12 0440 12/06/12 1957  AST 28 93*  ALT 31  53*  ALKPHOS 38* 59  BILITOT 0.2* 0.4  PROT 5.3* 7.1  ALBUMIN 2.7* 4.1    Lab 12/07/12 0021  LIPASE 44  AMYLASE --   No results found for this basename: AMMONIA:5 in the last 168 hours CBC:  Lab 12/08/12 0440 12/07/12 1536 12/06/12 1957  WBC 7.0 8.5 4.6  NEUTROABS -- -- 4.3  HGB 10.6* 11.3* 13.9  HCT 32.1* 33.7* 41.2  MCV 87.9 87.3 87.5  PLT 212 231 276   Cardiac Enzymes: No results found for this basename: CKTOTAL:5,CKMB:5,CKMBINDEX:5,TROPONINI:5 in the last 168 hours BNP (last 3 results)  Basename 12/08/12 0440 12/07/12 1137  PROBNP 1676.0* 534.7*   CBG: No results found for this basename: GLUCAP:5 in the last 168 hours  Recent Results (from the past 240 hour(s))  CSF CULTURE     Status: Normal (Preliminary result)   Collection Time   12/07/12  7:38 AM      Component Value Range Status Comment   Specimen Description CSF   Final    Special Requests NO2   Final    Gram Stain     Final    Value: NO WBC SEEN     NO ORGANISMS SEEN     Performed at [facility]   Culture NO GROWTH 1 DAY   Final    Report Status PENDING   Incomplete   GRAM STAIN     Status: Normal   Collection Time   12/07/12  7:38 AM      Component Value Range Status Comment   Specimen Description CSF   Final    Special Requests NO2   Final    Gram Stain     Final    Value: CYTOSPIN SLIDE     NO WBC SEEN     NO ORGANISMS SEEN   Report Status 12/07/2012 FINAL   Final   CULTURE, BLOOD (ROUTINE X 2)     Status: Normal (Preliminary result)   Collection Time   12/07/12  9:55 AM      Component Value Range Status Comment   Specimen Description BLOOD RIGHT ARM   Final    Special Requests BOTTLES DRAWN AEROBIC AND ANAEROBIC 10CC   Final    Culture  Setup Time 12/07/2012 16:56   Final    Culture     Final    Value:        BLOOD CULTURE RECEIVED NO GROWTH TO DATE CULTURE WILL BE HELD FOR 5 DAYS BEFORE ISSUING A FINAL NEGATIVE REPORT   Report Status PENDING   Incomplete   CULTURE, BLOOD (ROUTINE X  2)     Status: Normal (Preliminary result)   Collection Time   12/07/12 10:05 AM      Component Value Range Status Comment   Specimen  Description BLOOD RIGHT HAND   Final    Special Requests BOTTLES DRAWN AEROBIC ONLY 1CC   Final    Culture  Setup Time 12/07/2012 16:56   Final    Culture     Final    Value:        BLOOD CULTURE RECEIVED NO GROWTH TO DATE CULTURE WILL BE HELD FOR 5 DAYS BEFORE ISSUING A FINAL NEGATIVE REPORT   Report Status PENDING   Incomplete   MRSA PCR SCREENING     Status: Normal   Collection Time   12/07/12  2:31 PM      Component Value Range Status Comment   MRSA by PCR NEGATIVE  NEGATIVE Final      Studies: Dg Chest 2 View  12/07/2012  *RADIOLOGY REPORT*  Clinical Data: Chest pain and vomiting.  CHEST - 2 VIEW  Comparison: PA and lateral chest 07/27/2012 and 11/14/2012.  Findings: There is patchy left basilar airspace disease. Peribronchial thickening is noted.  Lung volumes are low.  Heart size is normal.  No pneumothorax or pleural effusion.  IMPRESSION: Bronchitic change.  Airspace disease the left base is likely due to atelectasis in this low-volume chest or possibly early pneumonia.   Original Report Authenticated By: Holley Dexter, M.D.    Dg Chest 2 View  11/14/2012  *RADIOLOGY REPORT*  Clinical Data: Cough and fever.  CHEST - 2 VIEW  Comparison: None.  Findings: Normal sized heart.  Clear lungs.  The interstitial markings are mildly prominent.  Thoracic spine degenerative changes.  IMPRESSION: Mild interstitial lung disease, most likely chronic.   Original Report Authenticated By: Beckie Salts, M.D.    Ct Head Wo Contrast  12/07/2012  *RADIOLOGY REPORT*  Clinical Data: Headache.  Nausea and vomiting.  CT HEAD WITHOUT CONTRAST  Technique:  Contiguous axial images were obtained from the base of the skull through the vertex without contrast.  Comparison: None.  Findings:   There is no evidence for acute infarction, intracranial hemorrhage, mass lesion, hydrocephalus,  or extra-axial fluid.  No atrophy or white matter disease.  Calvarium intact.No acute sinus or mastoid disease.  IMPRESSION: Negative exam.   Original Report Authenticated By: Davonna Belling, M.D.    Ct Chest W Contrast  12/08/2012  *RADIOLOGY REPORT*  Clinical Data: Left-sided chest pain  CT CHEST WITH CONTRAST  Technique:  Multidetector CT imaging of the chest was performed following the standard protocol during bolus administration of intravenous contrast.  Contrast: 80mL OMNIPAQUE IOHEXOL 300 MG/ML  SOLN  Comparison: Chest radiograph 12/07/2012  Findings: Multilobar airspace consolidation noted, most prominent left greater than right lower lobe and lingula.  Trace bilateral pleural effusions are noted.  Asymmetric left-sided interlobular septal thickening noted.  Central airways are patent.  Atelectatic bilateral lower lobe segments are perfused.  Heart size is normal.  Trace pericardial fluid is present.  No lymphadenopathy.  Great vessels are normal in caliber.  Incomplete imaging of the upper abdomen demonstrates normal-appearing adrenal glands in their visualized aspects; there is minimal nodularity to the left adrenal gland but no measurable mass lesion.  No acute osseous abnormality.  IMPRESSION: Multilobar airspace opacities with trace pleural effusions, which could represent pneumonia or atelectasis.  Asymmetric left hemithoracic interlobular septal thickening which could indicate asymmetric edema.   Original Report Authenticated By: Christiana Pellant, M.D.    US Abdomen Complete  12/07/2012  *RADIOLOGY REPORT*  Clinical Data:  Right upper quadrant pain and fever.  COMPLETE ABDOMINAL ULTRASOUND  Comparison:  None.  Findings:  Gallbladder:  No gallstones, gallbladder wall thickening, or pericholecystic fluid.  Common bile duct:  Measures 0.5 cm.  Liver:  No focal lesion identified.  Within normal limits in parenchymal echogenicity.  IVC:  Appears normal.  Pancreas:  No focal abnormality seen.  Spleen:   Measures 7.0 cm and appears normal.  Right Kidney:  Measures 10.6 cm and appears normal.  Left Kidney:  Measures 10.6 cm and appears normal.  Abdominal aorta:  No aneurysm identified.  IMPRESSION: Negative abdominal ultrasound.   Original Report Authenticated By: Holley Dexter, M.D.    Dg Chest Port 1 View  12/07/2012  *RADIOLOGY REPORT*  Clinical Data: Hypoxia.  Chest tightness.  PORTABLE CHEST - 1 VIEW  Comparison: 12/07/2012  Findings: Heart is normal size.  Diffuse interstitial prominence throughout the lungs, stable.  Left basilar atelectasis or infiltrate present.  Small left pleural effusion.  Opacity and effusion at the left base slightly increased since prior study.  No focal opacities or effusions on the right.  IMPRESSION: Diffuse interstitial prominence stable.  Slight increased left basilar atelectasis or infiltrate and small left effusion.   Original Report Authenticated By: Charlett Nose, M.D.     Scheduled Meds:   . ceFEPime (MAXIPIME) IV  1 g Intravenous Q8H  . levofloxacin (LEVAQUIN) IV  750 mg Intravenous Q24H  . ondansetron  4 mg Oral Q6H   Or  . ondansetron (ZOFRAN) IV  4 mg Intravenous Q6H  . sodium chloride  3 mL Intravenous Q12H  . vancomycin  750 mg Intravenous Q12H   Continuous Infusions:   . sodium chloride 1,000 mL (12/07/12 1213)    ________________________________________________________________________  Time spent: 35 min    [facility]  [facility] Pager [phone] If 8PM-8AM, please contact night-coverage at [email or web address], password [facility] 12/08/2012, 2:23 PM  LOS: 2 days

## 2012-12-08 NOTE — Progress Notes (Signed)
eLink Physician-Brief Progress Note Patient Name: Crystal [name] DOB: 1955/12/12 MRN: [id number]  Date of Service  12/08/2012   HPI/Events of Note  Hypokalemia  eICU Interventions  Potassium replaced   Intervention Category Minor Interventions: Electrolytes abnormality - evaluation and management  DETERDING,ELIZABETH 12/08/2012, 6:09 AM

## 2012-12-09 LAB — CBC
HCT: 34.6 % — ABNORMAL LOW (ref 36.0–46.0)
MCHC: 32.9 g/dL (ref 30.0–36.0)
MCV: 87.8 fL (ref 78.0–100.0)
Platelets: 237 10*3/uL (ref 150–400)
RDW: 14.9 % (ref 11.5–15.5)
WBC: 6 10*3/uL (ref 4.0–10.5)

## 2012-12-09 LAB — BASIC METABOLIC PANEL
BUN: 6 mg/dL (ref 6–23)
Chloride: 110 mEq/L (ref 96–112)
Creatinine, Ser: 0.6 mg/dL (ref 0.50–1.10)
GFR calc Af Amer: >90 mL/min (ref >90)
GFR calc non Af Amer: >90 mL/min (ref >90)

## 2012-12-09 LAB — PROCALCITONIN: Procalcitonin: 5.17 ng/mL

## 2012-12-09 MED ORDER — IBUPROFEN 600 MG PO TABS
600.0000 mg | ORAL_TABLET | Freq: Four times a day (QID) | ORAL | Status: DC | PRN
  Administered 2012-12-09 – 2012-12-10 (×4): 600 mg via ORAL
  Filled 2012-12-09 (×4): qty 1

## 2012-12-09 MED ORDER — LEVOFLOXACIN 500 MG PO TABS
500.0000 mg | ORAL_TABLET | Freq: Every day | ORAL | Status: DC
  Administered 2012-12-09 – 2012-12-10 (×2): 500 mg via ORAL
  Filled 2012-12-09 (×2): qty 1

## 2012-12-09 NOTE — Progress Notes (Signed)
PROGRESS NOTE  Crystal [name] MWN:[id number] DOB: 1956-08-04 DOA: 12/06/2012 PCP: Lenora Boys, MD  Brief narrative: 35 female admitted 12/08/11 with hypotension..pneumonia having recently received 10 day course of Omnicef 11/19/12-did not finish course. 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 Noted persistent hypotension which was aggressively repleted. LP performed due to headache 1/8 = WBC 1 protein 34, glucose72  Past medical history-As per Problem list Chart reviewed as below-  Admission 02/2908 for Fibroids and pelvic pain-s/p abd hys  Consultants:  Critical care medicine  Consultants:  pccm  Procedures:  Chest x-ray 12/07/12 = airspace disease left base likely due to atelectasis or early pneumonia Chest x-ray repeated CT chest 12/08/2012 = multilobar airspace past history 3 effusion which could represent pneumonia atelectasis, asymmetric left hemithoracic interlobular septal thickening which can dictate his  Antibiotics:  Vanc/ Cefepime/ Levaquin- 1/8>> Tamiflu 1.8 Blood culture 1/8 no growth CSf cult 1/8 NG   Subjective  Has headache, not as bad as prior once she's had before. Concerned about why she keeps getting pneumonia No nausea vomiting. Poor appetite however. Denies diarrhea denies chills   Objective    Interim History: None  Telemetry: Sinus rhythm no red alarms  Objective: Filed Vitals:   12/08/12 1534 12/08/12 1645 12/08/12 2025 12/09/12 0529  BP: 98/57 110/69 116/76 103/61  Pulse:  70 63 74  Temp: 97.4 F (36.3 C) 97.6 F (36.4 C) 98 F (36.7 C) 98.4 F (36.9 C)  TempSrc: Oral Oral Oral Oral  Resp: 19 18 18 18   Height:      Weight:    72.712 kg (160 lb 4.8 oz)  SpO2: 99% 93% 95% 90%    Intake/Output Summary (Last 24 hours) at 12/09/12 0925 Last data filed at 12/09/12 0059  Gross per 24 hour  Intake    625 ml  Output    350 ml  Net    275 ml     Exam:  General: Alert oriented but does not seem very comfortable Cardiovascular: S1-S2 no murmur rub gallop regular rate rhythm Respiratory: Clinically clear fremitus not resonance Abdomen: Soft nontender Skin no breakdown Neuro grossly intact  Data Reviewed: Basic Metabolic Panel:  Lab 12/09/12 4034 12/08/12 0440 12/07/12 1536 12/06/12 1957  NA 142 140 -- 140  K 3.5 3.4* -- --  CL 110 111 -- 98  CO2 23 22 -- 26  GLUCOSE 90 103* -- 92  BUN 6 12 -- 18  CREATININE 0.60 0.62 0.67 0.92  CALCIUM 8.6 8.1* -- 9.5  MG -- -- -- --  PHOS -- -- -- --   Liver Function Tests:  Lab 12/08/12 0440 12/06/12 1957  AST 28 93*  ALT 31 53*  ALKPHOS 38* 59  BILITOT 0.2* 0.4  PROT 5.3* 7.1  ALBUMIN 2.7* 4.1    Lab 12/07/12 0021  LIPASE 44  AMYLASE --   No results found for this basename: AMMONIA:5 in the last 168 hours CBC:  Lab 12/09/12 0544 12/08/12 0440 12/07/12 1536 12/06/12 1957  WBC 6.0 7.0 8.5 4.6  NEUTROABS -- -- -- 4.3  HGB 11.4* 10.6* 11.3* 13.9  HCT 34.6* 32.1* 33.7* 41.2  MCV 87.8 87.9 87.3 87.5  PLT 237 212 231 276   Cardiac Enzymes: No results found for this basename: CKTOTAL:5,CKMB:5,CKMBINDEX:5,TROPONINI:5 in the last 168 hours BNP: No components found with this basename: POCBNP:5 CBG: No results found for this basename: GLUCAP:5 in the last 168 hours  Recent Results (from the past 240 hour(s))  CSF CULTURE     Status: Normal (Preliminary result)   Collection Time   12/07/12  7:38 AM      Component Value Range Status Comment   Specimen Description CSF   Final    Special Requests NO2   Final    Gram Stain     Final    Value: NO WBC SEEN     NO ORGANISMS SEEN     Performed at [facility]   Culture NO GROWTH 1 DAY   Final    Report Status PENDING   Incomplete   GRAM STAIN     Status: Normal   Collection Time   12/07/12  7:38 AM      Component Value Range Status Comment   Specimen Description CSF   Final    Special Requests NO2   Final     Gram Stain     Final    Value: CYTOSPIN SLIDE     NO WBC SEEN     NO ORGANISMS SEEN   Report Status 12/07/2012 FINAL   Final   CULTURE, BLOOD (ROUTINE X 2)     Status: Normal (Preliminary result)   Collection Time   12/07/12  9:55 AM      Component Value Range Status Comment   Specimen Description BLOOD RIGHT ARM   Final    Special Requests BOTTLES DRAWN AEROBIC AND ANAEROBIC 10CC   Final    Culture  Setup Time 12/07/2012 16:56   Final    Culture     Final    Value:        BLOOD CULTURE RECEIVED NO GROWTH TO DATE CULTURE WILL BE HELD FOR 5 DAYS BEFORE ISSUING A FINAL NEGATIVE REPORT   Report Status PENDING   Incomplete   CULTURE, BLOOD (ROUTINE X 2)     Status: Normal (Preliminary result)   Collection Time   12/07/12 10:05 AM      Component Value Range Status Comment   Specimen Description BLOOD RIGHT HAND   Final    Special Requests BOTTLES DRAWN AEROBIC ONLY 1CC   Final    Culture  Setup Time 12/07/2012 16:56   Final    Culture     Final    Value:        BLOOD CULTURE RECEIVED NO GROWTH TO DATE CULTURE WILL BE HELD FOR 5 DAYS BEFORE ISSUING A FINAL NEGATIVE REPORT   Report Status PENDING   Incomplete   MRSA PCR SCREENING     Status: Normal   Collection Time   12/07/12  2:31 PM      Component Value Range Status Comment   MRSA by PCR NEGATIVE  NEGATIVE Final      Studies:              All Imaging reviewed and is as per above notation   Scheduled Meds:   . ceFEPime (MAXIPIME) IV  1 g Intravenous Q8H  . levofloxacin (LEVAQUIN) IV  750 mg Intravenous Q24H  . ondansetron  4 mg Oral Q6H   Or  . ondansetron (ZOFRAN) IV  4 mg Intravenous Q6H  . sodium chloride  3 mL Intravenous Q12H  . vancomycin  750 mg Intravenous Q12H   Continuous Infusions:    Assessment/Plan: 1. ? Viral illness with pleural effusions versus pneumonia-will narrow antibiotics to Levaquin po.  PCT was elevated-ndicating potnetial bact ONa on admit-this could represent sub-Rx from 10/2012.  Her initial  presentation sounded very suspicious for a viral illness given the myalgias and vomiting. would repeat chest x-ray in the morning to see if this is cleared. 2. ? Immunodeficiency-unlikely-see below, HIV test was negative, FLu neg 3. ?EBV-IGM <10.0, IgG 122.0-This could certainly explain her symptoms, as she had marginally elevated LFT's on admit-supportive management-RSV and CMV pending 4. Headache-unlikely related to recent lumbar puncture, probably 2/2 to #2. Continue ibuprofen. We'll try to hold off on giving further narcotics 5. Hypotension-this is constitutional. She states her blood pressures are usually about 90 6. Normal Echo this admit Ef 50-55% 7. Dilutional anemia-stable  Code Status: FUll Family Communication: NOne at bedside Disposition Plan: inpatient   Pleas Koch, MD  [facility] Pager [phone] 12/09/2012, 9:25 AM    LOS: 3 days

## 2012-12-09 NOTE — Evaluation (Signed)
Physical Therapy Evaluation Patient Details Name: Crystal [name] MRN: [id number] DOB: 1956-04-13 Today's Date: 12/09/2012 Time: 4098-1191 PT Time Calculation (min): 17 min  PT Assessment / Plan / Recommendation Clinical Impression  Patient is a 57 yo female admitted with hypotension, fever, headache, vomiting - ? pna.  Patient with general weakness, however independent with mobility/gait.  No balance deficits noted.  No acute PT needs identified - PT will sign off.  Encouraged patient to ambulate in hallway with family/nursing to build endurance.     PT Assessment  Patent does not need any further PT services    Follow Up Recommendations  No PT follow up;Supervision/Assistance - 24 hour    Does the patient have the potential to tolerate intense rehabilitation      Barriers to Discharge        Equipment Recommendations  None recommended by PT    Recommendations for Other Services     Frequency      Precautions / Restrictions Precautions Precautions: None Restrictions Weight Bearing Restrictions: No   Pertinent Vitals/Pain       Mobility  Bed Mobility Bed Mobility: Not assessed Transfers Transfers: Sit to Stand;Stand to Sit Sit to Stand: 7: Independent;From chair/3-in-1 Stand to Sit: 7: Independent;To chair/3-in-1 Details for Transfer Assistance: No cues or assistance needed Ambulation/Gait Ambulation/Gait Assistance: 6: Modified independent (Device/Increase time) Ambulation Distance (Feet): 200 Feet Assistive device: None Ambulation/Gait Assistance Details: Patient reaching occasionally for rail in hallway.  Patient with slow, deliberated gait pattern.  Increased time for gait due to decreased gait speed.  No loss of balance. Gait Pattern: Within Functional Limits;Decreased stride length Gait velocity: Slow gait speed      PT Goals  N/A  Visit Information  Last PT Received On: 12/09/12 Assistance Needed: +1    Subjective Data  Subjective: "I've been  getting up in the room" Patient Stated Goal: To stop getting sick.  To go home soon   Prior Functioning  Home Living Lives With: Spouse Available Help at Discharge: Family;Available 24 hours/day (for several days) Type of Home: House Home Access: Level entry Home Layout: One level Bathroom Shower/Tub: Health visitor: Standard Home Adaptive Equipment: None Prior Function Level of Independence: Independent Able to Take Stairs?: Yes Driving: Yes Vocation: Full time employment (Up/down, climbing stairs, walking) Communication Communication: No difficulties Dominant Hand: Left    Cognition  Overall Cognitive Status: Appears within functional limits for tasks assessed/performed Arousal/Alertness: Awake/alert Orientation Level: Oriented X4 / Intact Behavior During Session: [facility] for tasks performed    Extremity/Trunk Assessment Right Upper Extremity Assessment RUE ROM/Strength/Tone: WFL for tasks assessed RUE Sensation: WFL - Light Touch Left Upper Extremity Assessment LUE ROM/Strength/Tone: WFL for tasks assessed LUE Sensation: WFL - Light Touch Right Lower Extremity Assessment RLE ROM/Strength/Tone: WFL for tasks assessed RLE Sensation: WFL - Light Touch Left Lower Extremity Assessment LLE ROM/Strength/Tone: WFL for tasks assessed LLE Sensation: WFL - Light Touch Trunk Assessment Trunk Assessment: Normal   Balance Balance Balance Assessed: Yes High Level Balance High Level Balance Activites: Direction changes;Turns;Sudden stops;Head turns High Level Balance Comments: No loss of balance with high level balance activities.  End of Session PT - End of Session Activity Tolerance: Patient tolerated treatment well Patient left: in chair;with call bell/phone within reach;with family/visitor present Nurse Communication: Mobility status (Encouraged ambulation in hallway with family/nursing)  GP     Vena Austria 12/09/2012, 4:06 PM Durenda Hurt. Renaldo Fiddler,  [facility] Acute Rehab Services Pager [phone]

## 2012-12-10 ENCOUNTER — Inpatient Hospital Stay (HOSPITAL_COMMUNITY): Payer: 59

## 2012-12-10 LAB — CBC WITH DIFFERENTIAL/PLATELET
Basophils Relative: 1 % (ref 0–1)
Eosinophils Relative: 7 % — ABNORMAL HIGH (ref 0–5)
HCT: 36.9 % (ref 36.0–46.0)
Hemoglobin: 12.2 g/dL (ref 12.0–15.0)
MCHC: 33.1 g/dL (ref 30.0–36.0)
MCV: 87 fL (ref 78.0–100.0)
Monocytes Absolute: 0.4 10*3/uL (ref 0.1–1.0)
Monocytes Relative: 11 % (ref 3–12)
Neutro Abs: 1.6 10*3/uL — ABNORMAL LOW (ref 1.7–7.7)
RDW: 14.5 % (ref 11.5–15.5)

## 2012-12-10 LAB — CSF CULTURE W GRAM STAIN: Culture: NO GROWTH

## 2012-12-10 MED ORDER — IBUPROFEN 600 MG PO TABS: 600.0000 mg | ORAL_TABLET | Freq: Four times a day (QID) | ORAL | Status: DC | PRN

## 2012-12-10 MED ORDER — LEVOFLOXACIN 500 MG PO TABS: 500.0000 mg | ORAL_TABLET | Freq: Every day | ORAL | Status: DC

## 2012-12-10 NOTE — Discharge Summary (Signed)
Physician Discharge Summary  Crystal [name]:[id number] DOB: 25-Feb-1956 DOA: 12/06/2012  PCP: Lenora Boys, MD  Admit date: 12/06/2012 Discharge date: 12/10/2012  Time spent: 35 minutes  Recommendations for Outpatient Follow-up:  1. Needs Rpt CXR in about 6 weeks to ensure clearance of PNA 2. If she continues to have recurrent PNA's, would consider a referral non-emergently to an immunologist for further workup of this case-this is unlikely but a possibility and she may need also infectious disease to help out 3. Start date for Levaquin would be 12/18/2012  Discharge Diagnoses:  Principal Problem:  *Hypotension Active Problems:  Headache  Vomiting  Acute respiratory failure with hypoxia  Pulmonary infiltrate  Pleural effusion   Discharge Condition: Stable  Diet recommendation: Heart healthy low-salt  Filed Weights   12/08/12 0500 12/09/12 0529 12/10/12 0500  Weight: 73.029 kg (161 lb) 72.712 kg (160 lb 4.8 oz) 72.666 kg (160 lb 3.2 oz)    History of present illness:  1 female admitted 12/08/11 with hypotension.  Pneumonia having recently received 10 day course of Omnicef 11/19/12- she claims she completed a course of antibiotics. 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  LP performed due to headache 1/8 = WBC 1 protein 34, glucose72   Hospital Course:  1. ? Viral illness with pleural effusions versus pneumonia-will narrow antibiotics to Levaquin po 500mg , start date 12/18/2012. PCT was elevated-indicating potnetial bact PNa on admit-instructed to a level of 5 which conferred D. escalation of therapy.  Her initial presentation sounded very suspicious for a viral illness given the myalgias and vomiting. She will need an x-ray in about 6 weeks to ensure clearance of left lower lung infiltrates 2. ?  Immunodeficiency-unlikely-see below, HIV test was negative, FLu neg-patient has had 3 pneumonic episodes in the past year and has not aspirated. She potentially will need discussion with an immunologist for infectious disease specialist in the outpatient setting of this recurs, especially with her history of recent travel 3. ?EBV-IGM <10.0, IgG 122.0-This might symptoms, as she had marginally elevated LFT's on admit-supportive management-RSV pending and CMV neg 4. Headache-unlikely related to recent lumbar puncture, probably 2/2 to #2. Continue ibuprofen. We'll try to hold off on giving further narcotics 5. Hypotension-this is constitutional. She states her blood pressures are usually about 90 6. Normal Echo this admit Ef 50-55% 7. Dilutional anemia-stable  Chart reviewed as below-  Admission 02/2908 for Fibroids and pelvic pain-s/p abd hys  Consultants:  pccm  Procedures:  Chest x-ray 12/07/12 = airspace disease left base likely due to atelectasis or early pneumonia  Chest x-ray repeated  CT chest 12/08/2012 = multilobar airspace past history 3 effusion which could represent pneumonia atelectasis, asymmetric left hemithoracic interlobular septal thickening which can dictate his  Antibiotics:  Vanc/ Cefepime/ Levaquin- 1/8>> 1.10 Levaquin 1.10-1.19 STOP Date Tamiflu 1.8  Blood culture 1/8 no growth  CSf cult 1/8 NG CMV neg RSV Pending EBV IgG elevated, IgM was normal-unclear how to interpret  Discharge Exam: Filed Vitals:   12/09/12 0529 12/09/12 1400 12/09/12 2100 12/10/12 0500  BP: 103/61 107/63 150/82 107/71  Pulse: 74 82 70 65  Temp: 98.4 F (36.9 C) 98.1 F (36.7 C) 98 F (36.7 C) 98.2 F (36.8 C)  TempSrc: Oral Oral    Resp: 18 18 18 16   Height:      Weight: 72.712 kg (160 lb  4.8 oz)   72.666 kg (160 lb 3.2 oz)  SpO2: 90% 96% 98% 96%   Alert pleasant oriented, looking much better than prior. No further headaches although taking ibuprofen around the clock. No other real  issues. No nausea no vomiting although has not eaten as much as she has prior to  General: Alert pleasant oriented Cardiovascular: S1-S2 no murmur or gallop Respiratory: Clinically clear no added sound  Discharge Instructions  Discharge Orders    Future Orders Please Complete By Expires   Diet - low sodium heart healthy      Increase activity slowly      Call MD for:  persistant nausea and vomiting      Call MD for:  redness, tenderness, or signs of infection (pain, swelling, redness, odor or green/yellow discharge around incision site)      Call MD for:  hives      Call MD for:  extreme fatigue      Call MD for:  temperature >100.4          Medication List     As of 12/10/2012  8:38 AM    TAKE these medications         ibuprofen 600 MG tablet   Commonly known as: ADVIL,MOTRIN   Take 1 tablet (600 mg total) by mouth every 6 (six) hours as needed.      levofloxacin 500 MG tablet   Commonly known as: LEVAQUIN   Take 1 tablet (500 mg total) by mouth daily.      ondansetron 4 MG tablet   Commonly known as: ZOFRAN   Take 1 tablet (4 mg total) by mouth every 6 (six) hours. as needed for nausea          The results of significant diagnostics from this hospitalization (including imaging, microbiology, ancillary and laboratory) are listed below for reference.    Significant Diagnostic Studies: Dg Chest 2 View  12/10/2012  *RADIOLOGY REPORT*  Clinical Data: Follow up pneumonia.  CHEST - 2 VIEW  Comparison: 12/08/2012.  Findings: Small bilateral pleural effusions remain present. Cardiopericardial silhouette and mediastinal contours are within normal limits.  Collapse / consolidation of the lower lobes evident on the lateral view.  Trachea midline.  IMPRESSION: No interval change.  Small bilateral pleural effusions, left greater than right with mild collapse / consolidation of the lower lobes.   Original Report Authenticated By: Andreas Newport, M.D.    Dg Chest 2 View  12/07/2012   *RADIOLOGY REPORT*  Clinical Data: Chest pain and vomiting.  CHEST - 2 VIEW  Comparison: PA and lateral chest 07/27/2012 and 11/14/2012.  Findings: There is patchy left basilar airspace disease. Peribronchial thickening is noted.  Lung volumes are low.  Heart size is normal.  No pneumothorax or pleural effusion.  IMPRESSION: Bronchitic change.  Airspace disease the left base is likely due to atelectasis in this low-volume chest or possibly early pneumonia.   Original Report Authenticated By: Holley Dexter, M.D.    Dg Chest 2 View  11/14/2012  *RADIOLOGY REPORT*  Clinical Data: Cough and fever.  CHEST - 2 VIEW  Comparison: None.  Findings: Normal sized heart.  Clear lungs.  The interstitial markings are mildly prominent.  Thoracic spine degenerative changes.  IMPRESSION: Mild interstitial lung disease, most likely chronic.   Original Report Authenticated By: Beckie Salts, M.D.    Ct Head Wo Contrast  12/07/2012  *RADIOLOGY REPORT*  Clinical Data: Headache.  Nausea and vomiting.  CT HEAD WITHOUT CONTRAST  Technique:  Contiguous axial images were obtained from the base of the skull through the vertex without contrast.  Comparison: None.  Findings:   There is no evidence for acute infarction, intracranial hemorrhage, mass lesion, hydrocephalus, or extra-axial fluid.  No atrophy or white matter disease.  Calvarium intact.No acute sinus or mastoid disease.  IMPRESSION: Negative exam.   Original Report Authenticated By: Davonna Belling, M.D.    Ct Chest W Contrast  12/08/2012  *RADIOLOGY REPORT*  Clinical Data: Left-sided chest pain  CT CHEST WITH CONTRAST  Technique:  Multidetector CT imaging of the chest was performed following the standard protocol during bolus administration of intravenous contrast.  Contrast: 80mL OMNIPAQUE IOHEXOL 300 MG/ML  SOLN  Comparison: Chest radiograph 12/07/2012  Findings: Multilobar airspace consolidation noted, most prominent left greater than right lower lobe and lingula.  Trace  bilateral pleural effusions are noted.  Asymmetric left-sided interlobular septal thickening noted.  Central airways are patent.  Atelectatic bilateral lower lobe segments are perfused.  Heart size is normal.  Trace pericardial fluid is present.  No lymphadenopathy.  Great vessels are normal in caliber.  Incomplete imaging of the upper abdomen demonstrates normal-appearing adrenal glands in their visualized aspects; there is minimal nodularity to the left adrenal gland but no measurable mass lesion.  No acute osseous abnormality.  IMPRESSION: Multilobar airspace opacities with trace pleural effusions, which could represent pneumonia or atelectasis.  Asymmetric left hemithoracic interlobular septal thickening which could indicate asymmetric edema.   Original Report Authenticated By: Christiana Pellant, M.D.    US Abdomen Complete  12/07/2012  *RADIOLOGY REPORT*  Clinical Data:  Right upper quadrant pain and fever.  COMPLETE ABDOMINAL ULTRASOUND  Comparison:  None.  Findings:  Gallbladder:  No gallstones, gallbladder wall thickening, or pericholecystic fluid.  Common bile duct:  Measures 0.5 cm.  Liver:  No focal lesion identified.  Within normal limits in parenchymal echogenicity.  IVC:  Appears normal.  Pancreas:  No focal abnormality seen.  Spleen:  Measures 7.0 cm and appears normal.  Right Kidney:  Measures 10.6 cm and appears normal.  Left Kidney:  Measures 10.6 cm and appears normal.  Abdominal aorta:  No aneurysm identified.  IMPRESSION: Negative abdominal ultrasound.   Original Report Authenticated By: Holley Dexter, M.D.    Dg Chest Port 1 View  12/07/2012  *RADIOLOGY REPORT*  Clinical Data: Hypoxia.  Chest tightness.  PORTABLE CHEST - 1 VIEW  Comparison: 12/07/2012  Findings: Heart is normal size.  Diffuse interstitial prominence throughout the lungs, stable.  Left basilar atelectasis or infiltrate present.  Small left pleural effusion.  Opacity and effusion at the left base slightly increased since  prior study.  No focal opacities or effusions on the right.  IMPRESSION: Diffuse interstitial prominence stable.  Slight increased left basilar atelectasis or infiltrate and small left effusion.   Original Report Authenticated By: Charlett Nose, M.D.     Microbiology: Recent Results (from the past 240 hour(s))  CSF CULTURE     Status: Normal (Preliminary result)   Collection Time   12/07/12  7:38 AM      Component Value Range Status Comment   Specimen Description CSF   Final    Special Requests NO2   Final    Gram Stain     Final    Value: NO WBC SEEN     NO ORGANISMS SEEN     Performed at [facility]   Culture NO GROWTH 2 DAYS   Final    Report Status  PENDING   Incomplete   GRAM STAIN     Status: Normal   Collection Time   12/07/12  7:38 AM      Component Value Range Status Comment   Specimen Description CSF   Final    Special Requests NO2   Final    Gram Stain     Final    Value: CYTOSPIN SLIDE     NO WBC SEEN     NO ORGANISMS SEEN   Report Status 12/07/2012 FINAL   Final   CULTURE, BLOOD (ROUTINE X 2)     Status: Normal (Preliminary result)   Collection Time   12/07/12  9:55 AM      Component Value Range Status Comment   Specimen Description BLOOD RIGHT ARM   Final    Special Requests BOTTLES DRAWN AEROBIC AND ANAEROBIC 10CC   Final    Culture  Setup Time 12/07/2012 16:56   Final    Culture     Final    Value:        BLOOD CULTURE RECEIVED NO GROWTH TO DATE CULTURE WILL BE HELD FOR 5 DAYS BEFORE ISSUING A FINAL NEGATIVE REPORT   Report Status PENDING   Incomplete   CULTURE, BLOOD (ROUTINE X 2)     Status: Normal (Preliminary result)   Collection Time   12/07/12 10:05 AM      Component Value Range Status Comment   Specimen Description BLOOD RIGHT HAND   Final    Special Requests BOTTLES DRAWN AEROBIC ONLY 1CC   Final    Culture  Setup Time 12/07/2012 16:56   Final    Culture     Final    Value:        BLOOD CULTURE RECEIVED NO GROWTH TO DATE CULTURE WILL BE HELD FOR 5  DAYS BEFORE ISSUING A FINAL NEGATIVE REPORT   Report Status PENDING   Incomplete   MRSA PCR SCREENING     Status: Normal   Collection Time   12/07/12  2:31 PM      Component Value Range Status Comment   MRSA by PCR NEGATIVE  NEGATIVE Final      Labs: Basic Metabolic Panel:  Lab 12/09/12 1610 12/08/12 0440 12/07/12 1536 12/06/12 1957  NA 142 140 -- 140  K 3.5 3.4* -- 3.6  CL 110 111 -- 98  CO2 23 22 -- 26  GLUCOSE 90 103* -- 92  BUN 6 12 -- 18  CREATININE 0.60 0.62 0.67 0.92  CALCIUM 8.6 8.1* -- 9.5  MG -- -- -- --  PHOS -- -- -- --   Liver Function Tests:  Lab 12/08/12 0440 12/06/12 1957  AST 28 93*  ALT 31 53*  ALKPHOS 38* 59  BILITOT 0.2* 0.4  PROT 5.3* 7.1  ALBUMIN 2.7* 4.1    Lab 12/07/12 0021  LIPASE 44  AMYLASE --   No results found for this basename: AMMONIA:5 in the last 168 hours CBC:  Lab 12/10/12 0640 12/09/12 0544 12/08/12 0440 12/07/12 1536 12/06/12 1957  WBC 3.9* 6.0 7.0 8.5 4.6  NEUTROABS 1.6* -- -- -- 4.3  HGB 12.2 11.4* 10.6* 11.3* 13.9  HCT 36.9 34.6* 32.1* 33.7* 41.2  MCV 87.0 87.8 87.9 87.3 87.5  PLT 243 237 212 231 276   Cardiac Enzymes: No results found for this basename: CKTOTAL:5,CKMB:5,CKMBINDEX:5,TROPONINI:5 in the last 168 hours BNP: BNP (last 3 results)  Basename 12/08/12 0440 12/07/12 1137  PROBNP 1676.0* 534.7*   CBG: No results found for  this basename: GLUCAP:5 in the last 168 hours     Signed:  Rhetta Mura  [facility] 12/10/2012, 8:38 AM

## 2012-12-13 LAB — CULTURE, BLOOD (ROUTINE X 2): Culture: NO GROWTH

## 2012-12-15 LAB — RSV(RESPIRATORY SYNCYTIAL VIRUS) AB, BLOOD

## 2013-03-06 DIAGNOSIS — J302 Other seasonal allergic rhinitis: Secondary | ICD-10-CM | POA: Insufficient documentation

## 2013-03-06 DIAGNOSIS — R9389 Abnormal findings on diagnostic imaging of other specified body structures: Secondary | ICD-10-CM | POA: Insufficient documentation

## 2013-03-06 DIAGNOSIS — J189 Pneumonia, unspecified organism: Secondary | ICD-10-CM | POA: Insufficient documentation

## 2013-09-04 ENCOUNTER — Other Ambulatory Visit: Payer: Self-pay

## 2013-09-04 DIAGNOSIS — Z1231 Encounter for screening mammogram for malignant neoplasm of breast: Secondary | ICD-10-CM

## 2013-10-02 ENCOUNTER — Ambulatory Visit
Admission: RE | Admit: 2013-10-02 | Discharge: 2013-10-02 | Disposition: A | Discharge status: Home / Self Care | Payer: 59 | Source: Ambulatory Visit

## 2013-10-02 DIAGNOSIS — Z1231 Encounter for screening mammogram for malignant neoplasm of breast: Secondary | ICD-10-CM

## 2013-10-04 ENCOUNTER — Other Ambulatory Visit: Payer: Self-pay | Admitting: Obstetrics and Gynecology

## 2013-10-04 DIAGNOSIS — R928 Other abnormal and inconclusive findings on diagnostic imaging of breast: Secondary | ICD-10-CM

## 2013-10-23 ENCOUNTER — Ambulatory Visit
Admission: RE | Admit: 2013-10-23 | Discharge: 2013-10-23 | Disposition: A | Discharge status: Home / Self Care | Payer: 59 | Source: Ambulatory Visit | Attending: Obstetrics and Gynecology | Admitting: Obstetrics and Gynecology

## 2013-10-23 DIAGNOSIS — R928 Other abnormal and inconclusive findings on diagnostic imaging of breast: Secondary | ICD-10-CM

## 2013-12-06 DIAGNOSIS — B999 Unspecified infectious disease: Secondary | ICD-10-CM | POA: Insufficient documentation

## 2013-12-20 IMAGING — CT CT HEAD W/O CM
2 series · 16 of 30 positions shown, 20 images · non-contrast
Comparison: None.

CLINICAL DATA: Headache.  Nausea and vomiting.

CT HEAD WITHOUT CONTRAST
TECHNIQUE: Contiguous axial images were obtained from the base of
the skull through the vertex without contrast.

[Series 2: head routine 4.8 h37s · axial · 0.43mm/px · z∈[-109,+31]mm · 13 of 34 slices shown, 17 images]
[im 3/34  brain]
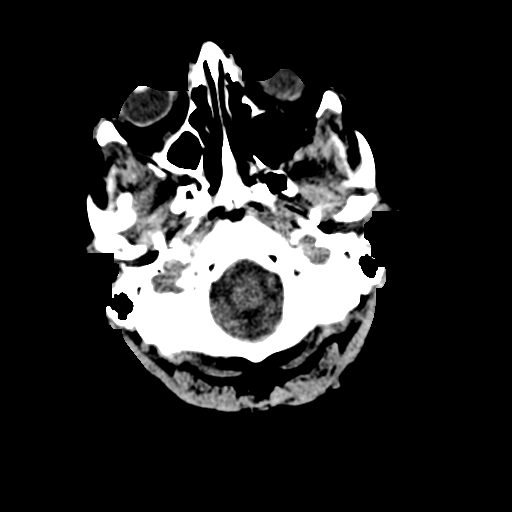
[im 3/34  bone]
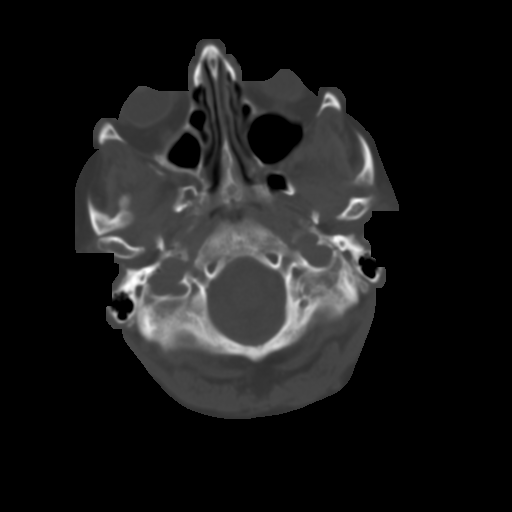
[im 5/34  brain]
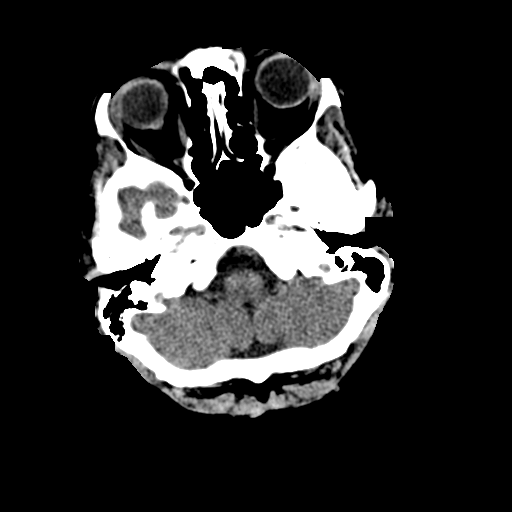
[im 8/34  brain]
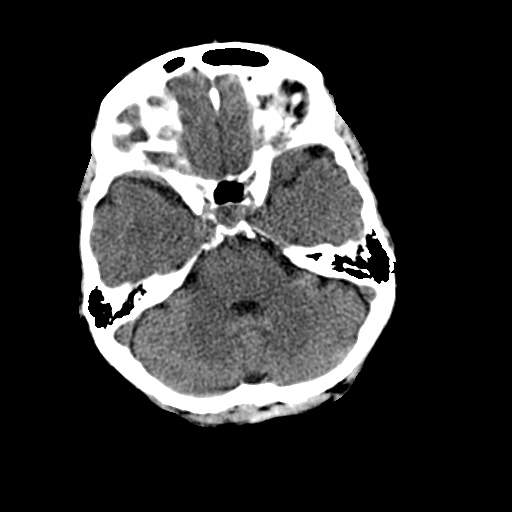
[im 10/34  brain]
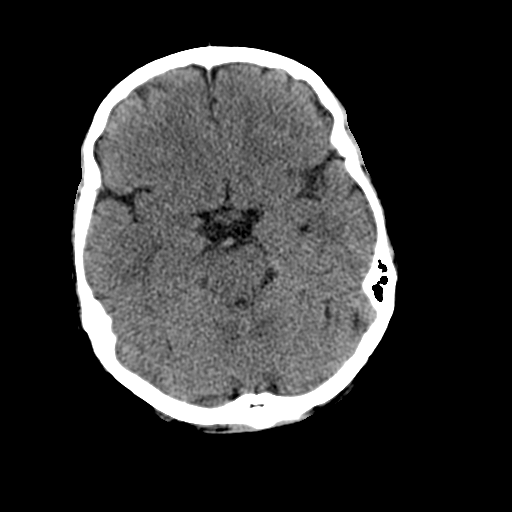
[im 12/34  brain]
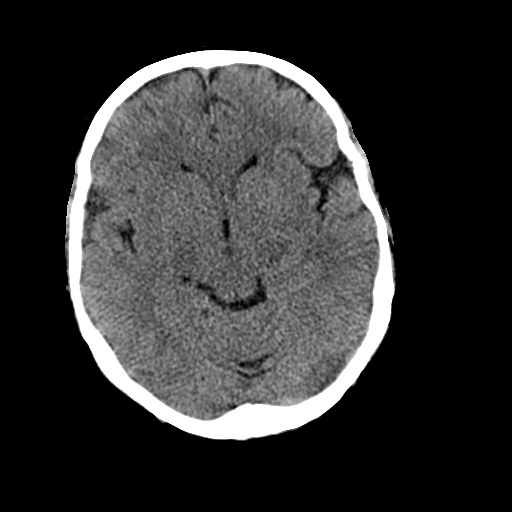
[im 12/34  bone]
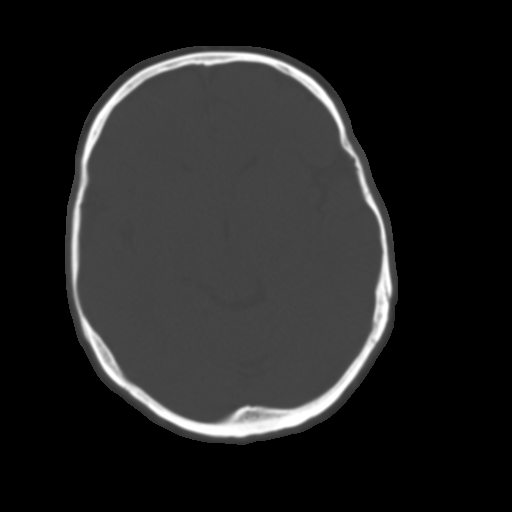
[im 15/34  brain]
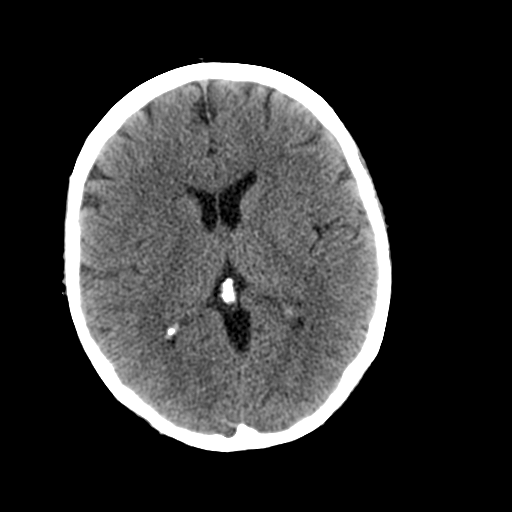
[im 17/34  brain]
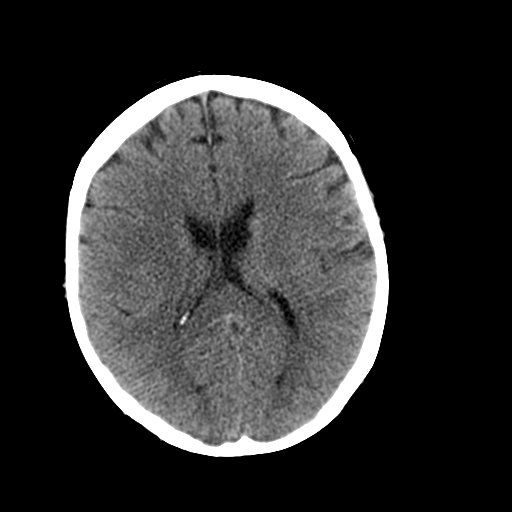
[im 19/34  brain]
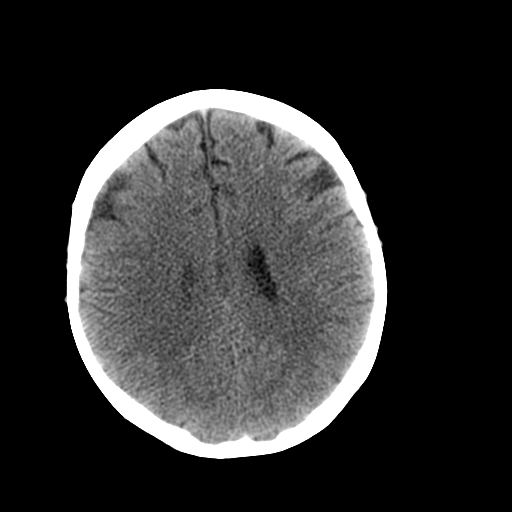
[im 22/34  brain]
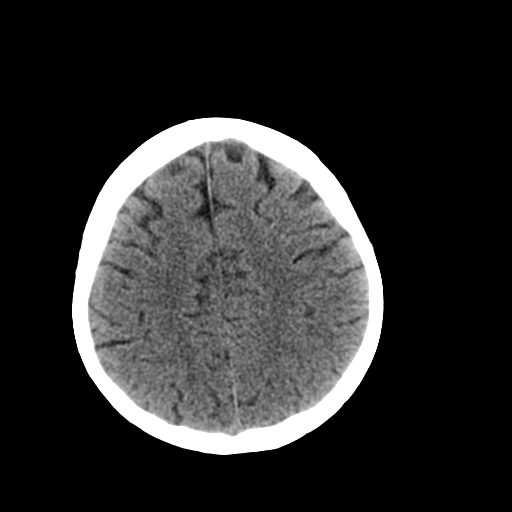
[im 22/34  bone]
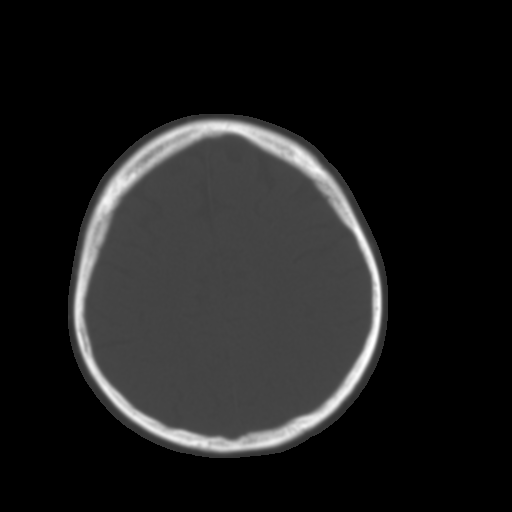
[im 24/34  brain]
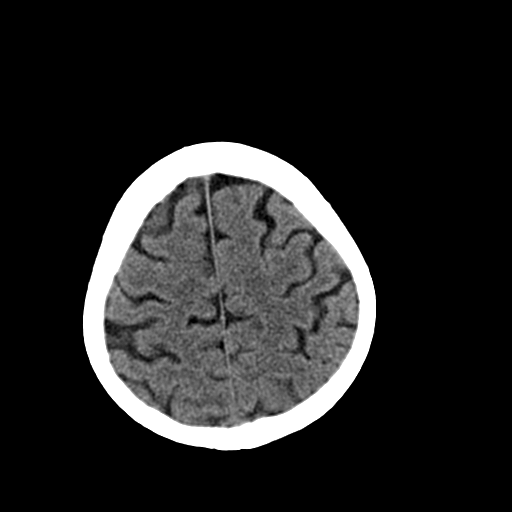
[im 26/34  brain]
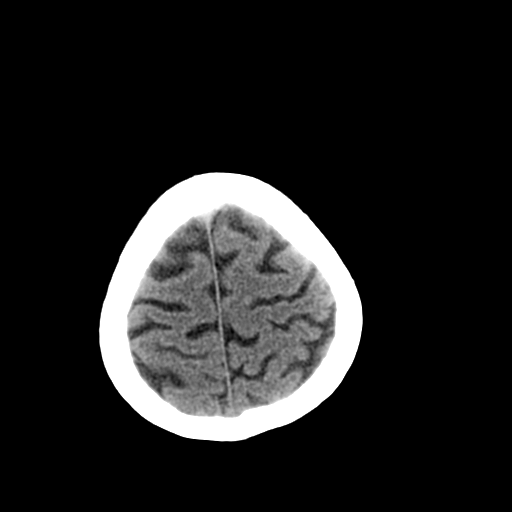
[im 29/34  brain]
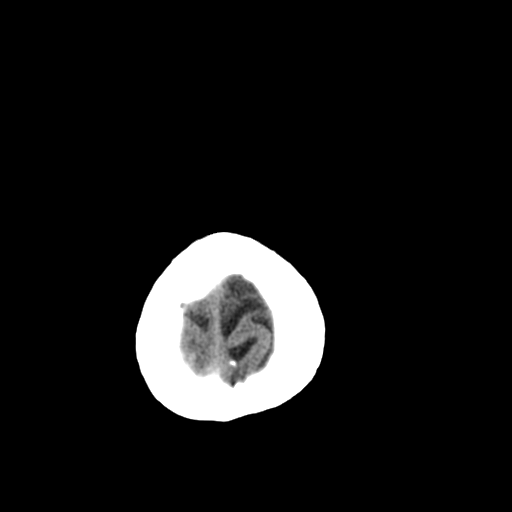
[im 31/34  brain]
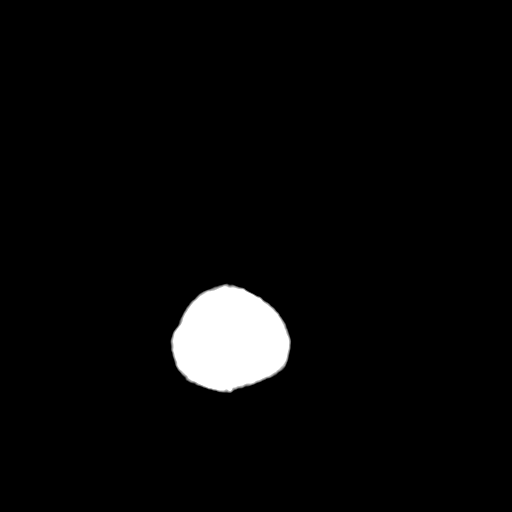
[im 31/34  bone]
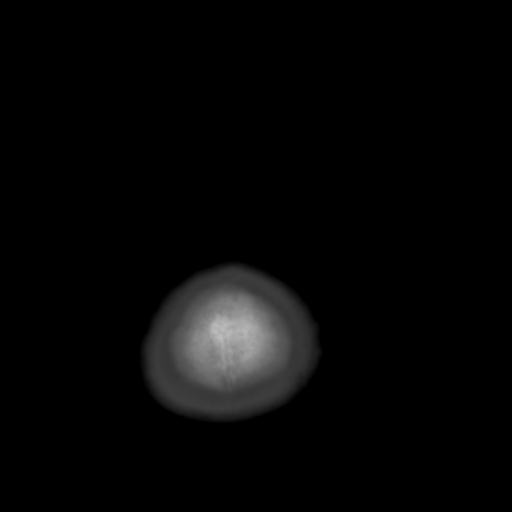

[Series 3: head routine 4.8 h60s · axial · 0.43mm/px · z∈[-109,-59]mm · 3 of 36 slices shown]
[im 3/36  brain]
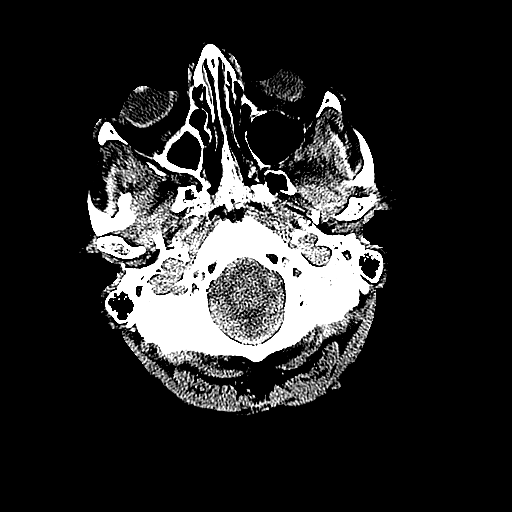
[im 8/36  brain]
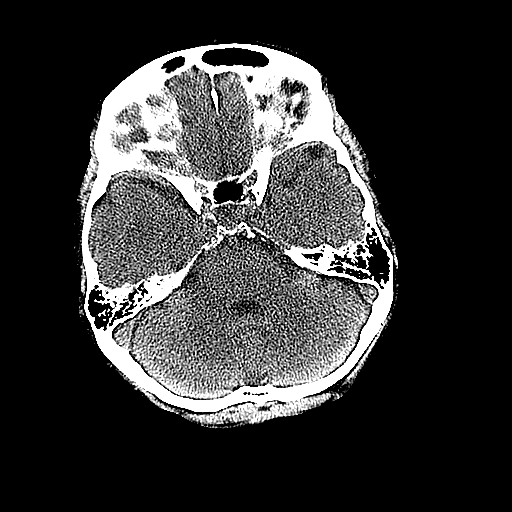
[im 13/36  brain]
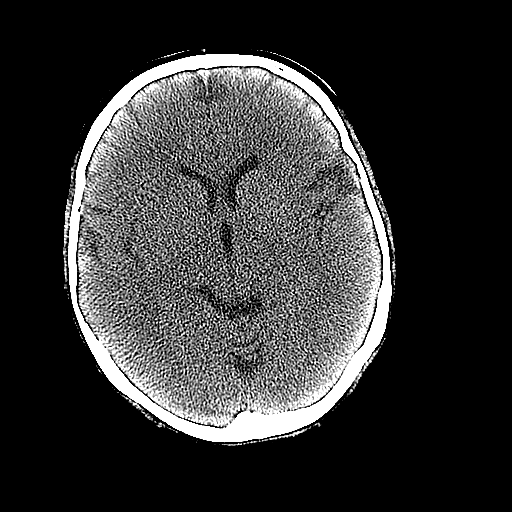

[16 of 30 positions shown; findings below may reference images not displayed]

FINDINGS: There is no evidence for acute infarction, intracranial
hemorrhage, mass lesion, hydrocephalus, or extra-axial fluid.  No
atrophy or white matter disease.  Calvarium intact.No acute sinus
or mastoid disease.
IMPRESSION: Negative exam.

## 2013-12-23 IMAGING — CR DG CHEST 2V
2 series · 2 of 2 positions shown · non-contrast
Comparison: 12/08/2012.

CLINICAL DATA: Follow up pneumonia.

CHEST - 2 VIEW

[w chest pa]
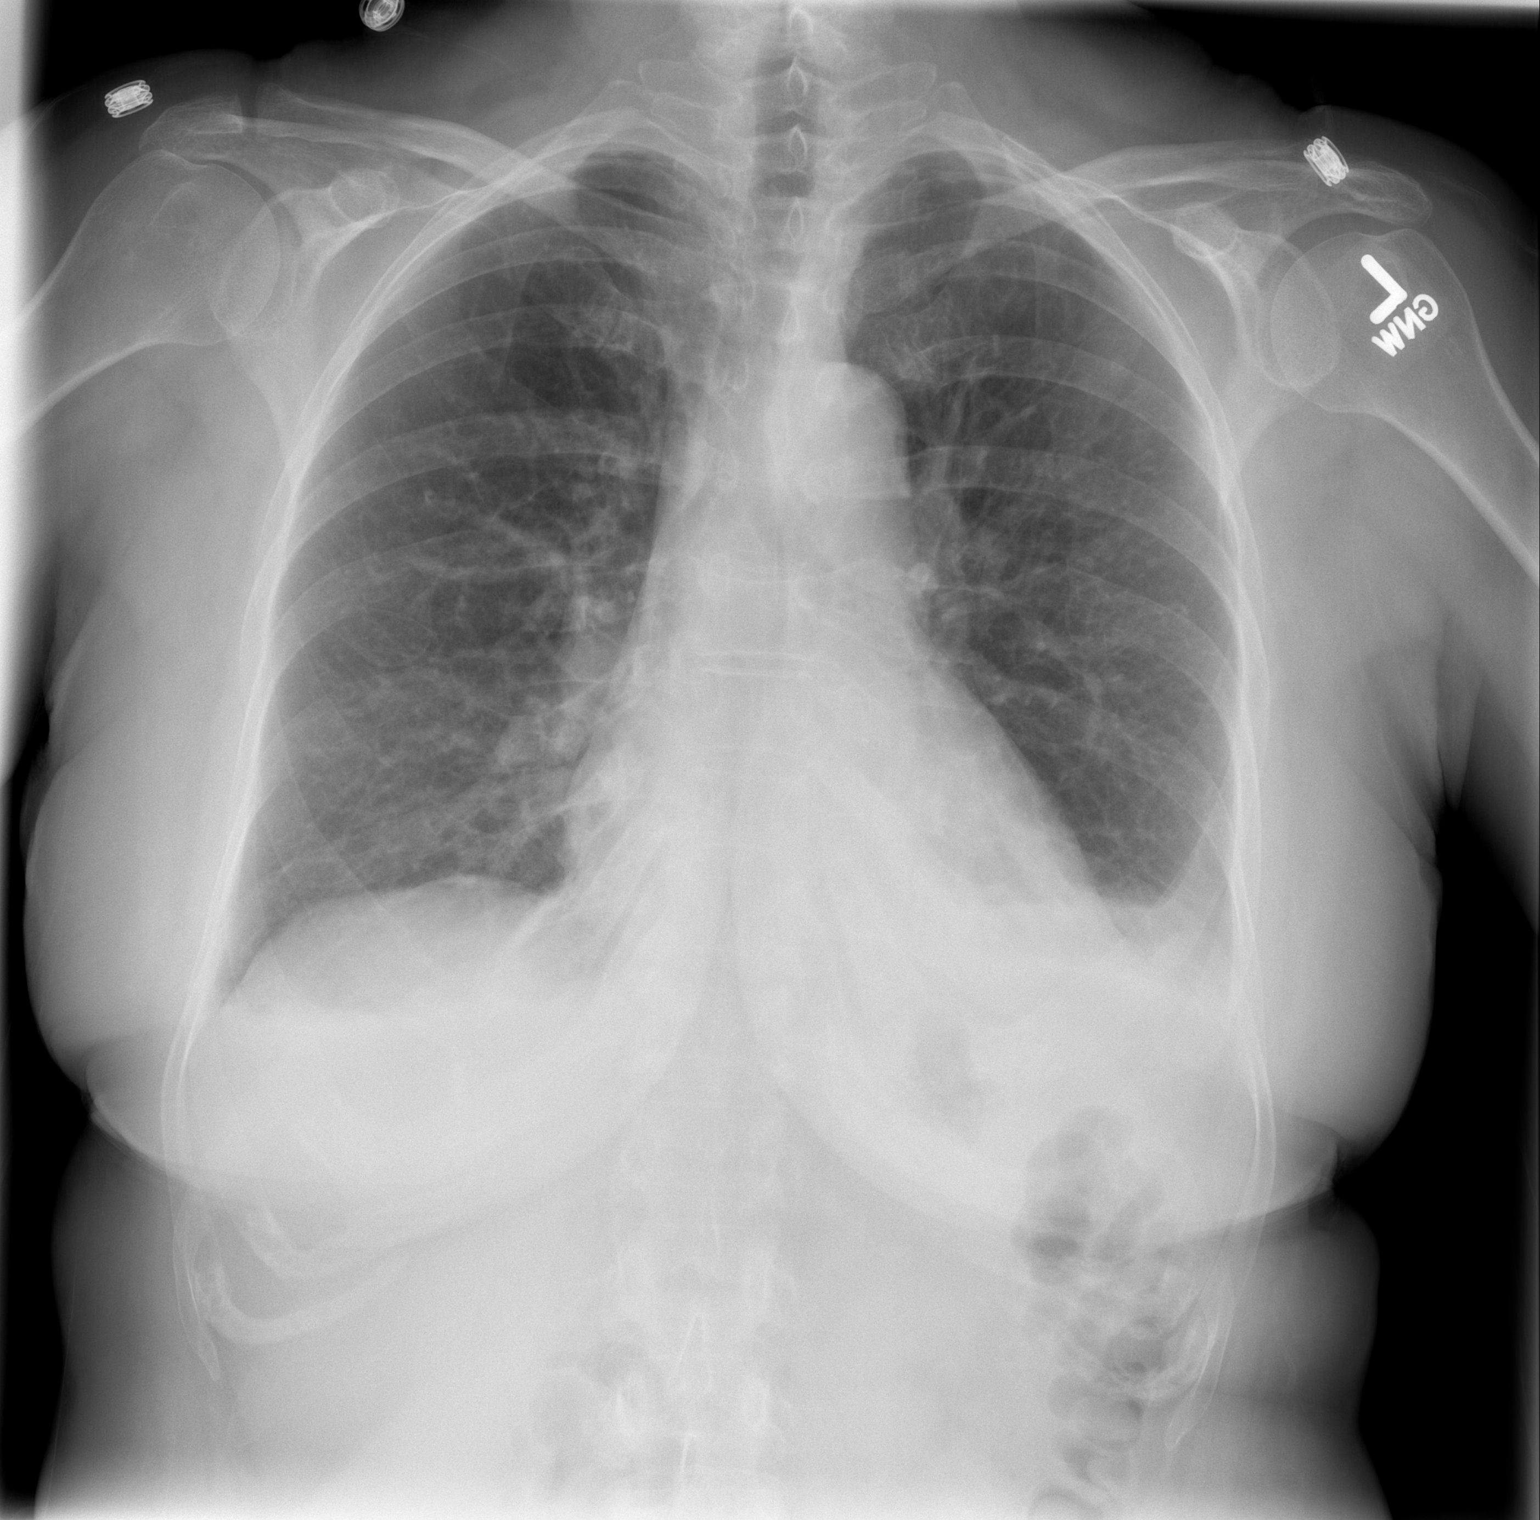

[w chest lat]
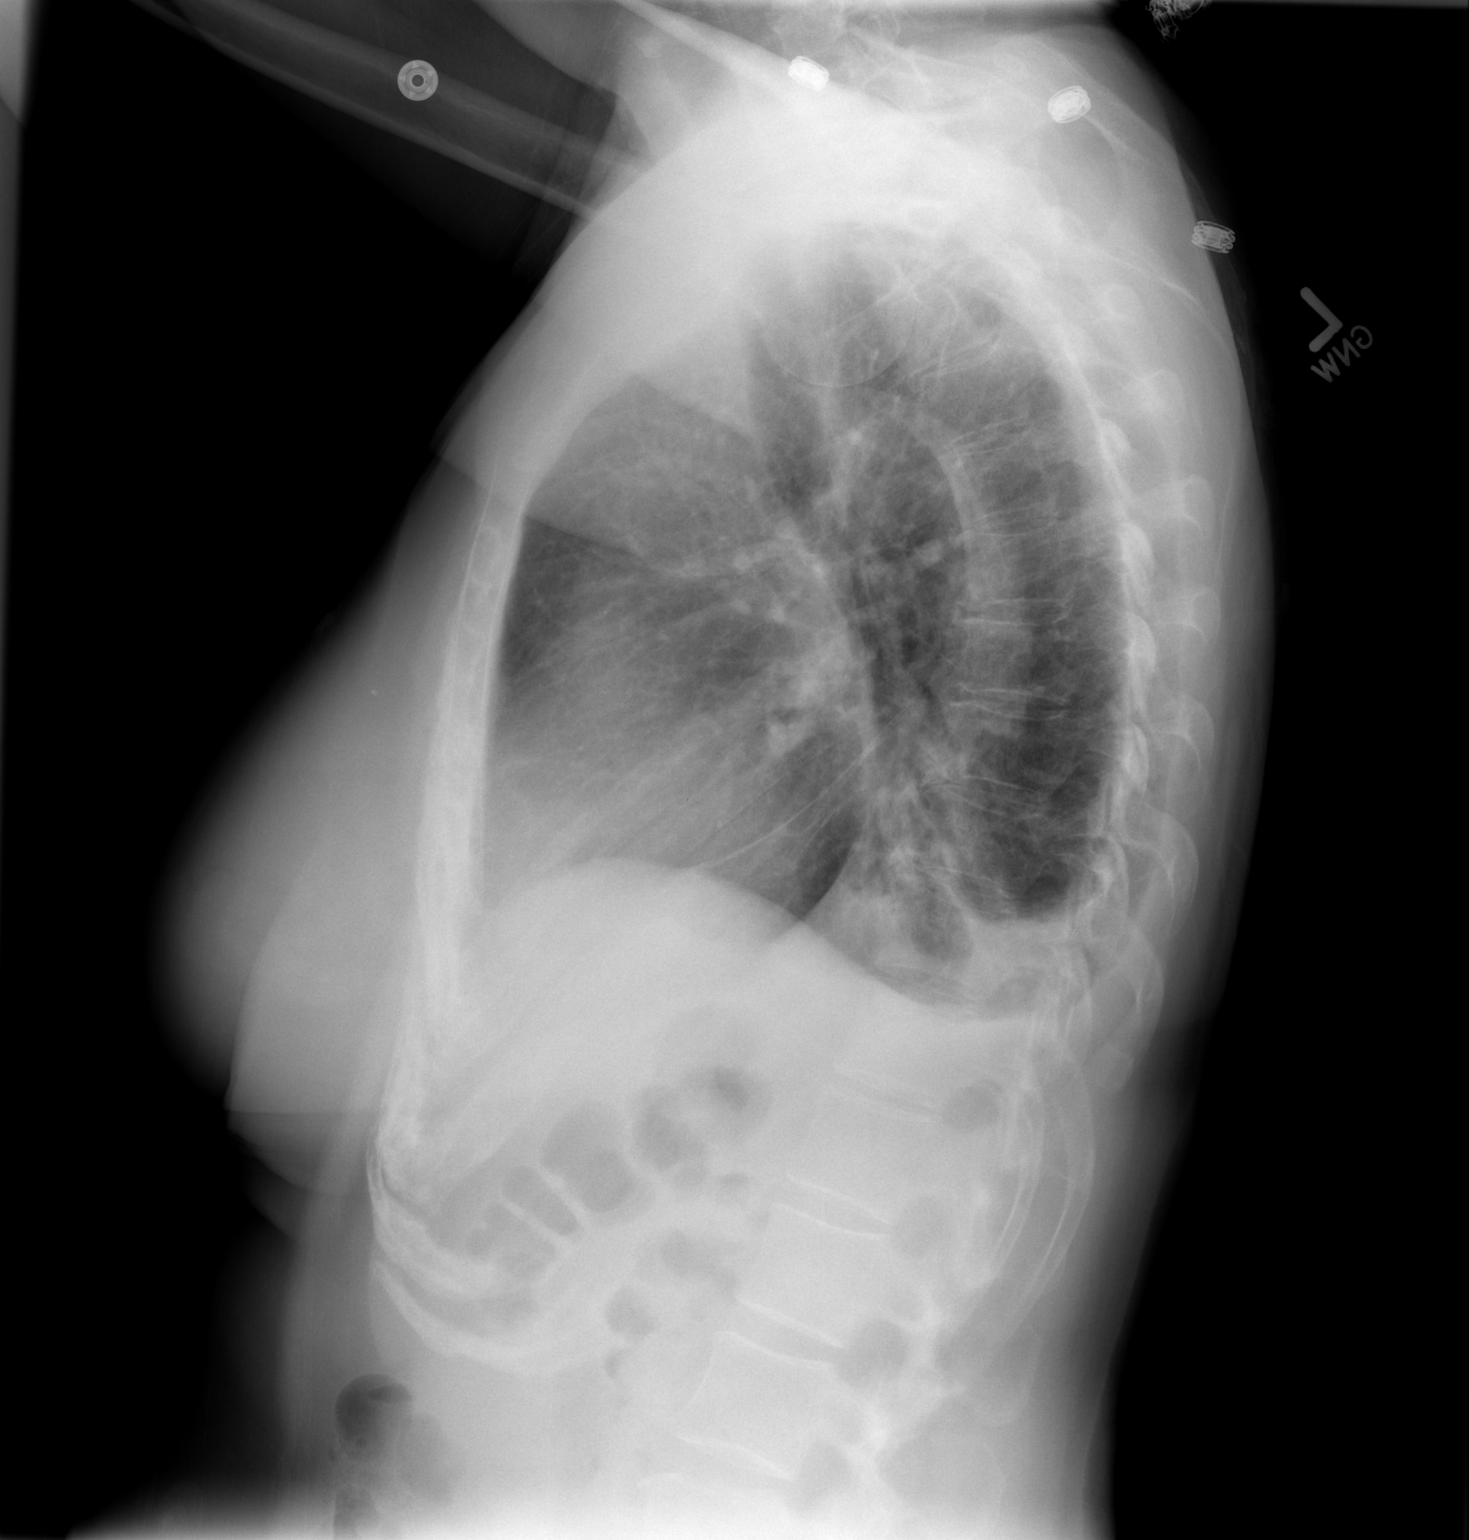

[2 of 2 positions shown; findings below may reference images not displayed]

FINDINGS: Small bilateral pleural effusions remain present.
Cardiopericardial silhouette and mediastinal contours are within
normal limits.  Collapse / consolidation of the lower lobes evident
on the lateral view.  Trachea midline.
IMPRESSION: No interval change.  Small bilateral pleural effusions, left
greater than right with mild collapse / consolidation of the lower
lobes.

## 2014-05-22 ENCOUNTER — Encounter: Payer: Self-pay | Admitting: Internal Medicine

## 2014-09-17 ENCOUNTER — Other Ambulatory Visit: Payer: Self-pay

## 2014-09-17 DIAGNOSIS — Z1231 Encounter for screening mammogram for malignant neoplasm of breast: Secondary | ICD-10-CM

## 2014-10-12 ENCOUNTER — Ambulatory Visit
Admission: RE | Admit: 2014-10-12 | Discharge: 2014-10-12 | Disposition: A | Discharge status: Home / Self Care | Payer: 59 | Source: Ambulatory Visit

## 2014-10-12 DIAGNOSIS — Z1231 Encounter for screening mammogram for malignant neoplasm of breast: Secondary | ICD-10-CM

## 2015-09-06 ENCOUNTER — Other Ambulatory Visit: Payer: Self-pay

## 2015-09-06 DIAGNOSIS — Z1231 Encounter for screening mammogram for malignant neoplasm of breast: Secondary | ICD-10-CM

## 2015-10-17 ENCOUNTER — Ambulatory Visit
Admission: RE | Admit: 2015-10-17 | Discharge: 2015-10-17 | Disposition: A | Discharge status: Home / Self Care | Payer: Commercial Managed Care - HMO | Source: Ambulatory Visit

## 2015-10-17 DIAGNOSIS — Z1231 Encounter for screening mammogram for malignant neoplasm of breast: Secondary | ICD-10-CM

## 2015-10-22 ENCOUNTER — Other Ambulatory Visit: Payer: Self-pay | Admitting: Obstetrics and Gynecology

## 2015-10-22 DIAGNOSIS — R928 Other abnormal and inconclusive findings on diagnostic imaging of breast: Secondary | ICD-10-CM

## 2015-10-30 ENCOUNTER — Ambulatory Visit
Admission: RE | Admit: 2015-10-30 | Discharge: 2015-10-30 | Disposition: A | Discharge status: Home / Self Care | Payer: Commercial Managed Care - HMO | Source: Ambulatory Visit | Attending: Obstetrics and Gynecology | Admitting: Obstetrics and Gynecology

## 2015-10-30 ENCOUNTER — Other Ambulatory Visit: Payer: Self-pay | Admitting: Obstetrics and Gynecology

## 2015-10-30 DIAGNOSIS — R928 Other abnormal and inconclusive findings on diagnostic imaging of breast: Secondary | ICD-10-CM

## 2015-10-30 DIAGNOSIS — R921 Mammographic calcification found on diagnostic imaging of breast: Secondary | ICD-10-CM

## 2015-11-01 ENCOUNTER — Ambulatory Visit
Admission: RE | Admit: 2015-11-01 | Discharge: 2015-11-01 | Disposition: A | Discharge status: Home / Self Care | Payer: Commercial Managed Care - HMO | Source: Ambulatory Visit | Attending: Obstetrics and Gynecology | Admitting: Obstetrics and Gynecology

## 2015-11-01 ENCOUNTER — Other Ambulatory Visit: Payer: Self-pay | Admitting: Obstetrics and Gynecology

## 2015-11-01 DIAGNOSIS — R921 Mammographic calcification found on diagnostic imaging of breast: Secondary | ICD-10-CM

## 2015-11-26 ENCOUNTER — Other Ambulatory Visit: Payer: Self-pay | Admitting: General Surgery

## 2015-11-26 DIAGNOSIS — N6099 Unspecified benign mammary dysplasia of unspecified breast: Secondary | ICD-10-CM

## 2015-11-27 ENCOUNTER — Other Ambulatory Visit: Payer: Self-pay | Admitting: General Surgery

## 2015-11-27 DIAGNOSIS — N6099 Unspecified benign mammary dysplasia of unspecified breast: Secondary | ICD-10-CM

## 2015-12-01 HISTORY — PX: BREAST EXCISIONAL BIOPSY: SUR124

## 2015-12-05 ENCOUNTER — Encounter (HOSPITAL_BASED_OUTPATIENT_CLINIC_OR_DEPARTMENT_OTHER): Payer: Self-pay | Admitting: *Deleted

## 2015-12-06 ENCOUNTER — Encounter (HOSPITAL_BASED_OUTPATIENT_CLINIC_OR_DEPARTMENT_OTHER)
Admission: RE | Admit: 2015-12-06 | Discharge: 2015-12-06 | Disposition: A | Discharge status: Home / Self Care | Payer: Commercial Managed Care - HMO | Source: Ambulatory Visit | Attending: General Surgery | Admitting: General Surgery

## 2015-12-06 DIAGNOSIS — Z01812 Encounter for preprocedural laboratory examination: Secondary | ICD-10-CM | POA: Insufficient documentation

## 2015-12-06 DIAGNOSIS — N6092 Unspecified benign mammary dysplasia of left breast: Secondary | ICD-10-CM | POA: Insufficient documentation

## 2015-12-06 LAB — BASIC METABOLIC PANEL
ANION GAP: 8 (ref 5–15)
BUN: 10 mg/dL (ref 6–20)
CALCIUM: 9.5 mg/dL (ref 8.9–10.3)
CHLORIDE: 103 mmol/L (ref 101–111)
CO2: 27 mmol/L (ref 22–32)
Creatinine, Ser: 0.78 mg/dL (ref 0.44–1.00)
GFR calc non Af Amer: >60 mL/min (ref >60)
Glucose, Bld: 101 mg/dL — ABNORMAL HIGH (ref 65–99)
Potassium: 4.3 mmol/L (ref 3.5–5.1)
SODIUM: 138 mmol/L (ref 135–145)

## 2015-12-06 LAB — CBC WITH DIFFERENTIAL/PLATELET
Basophils Absolute: 0 10*3/uL (ref 0.0–0.1)
Basophils Relative: 1 %
Eosinophils Absolute: 0.1 10*3/uL (ref 0.0–0.7)
Eosinophils Relative: 1 %
HEMATOCRIT: 37.7 % (ref 36.0–46.0)
HEMOGLOBIN: 12.5 g/dL (ref 12.0–15.0)
LYMPHS ABS: 1.7 10*3/uL (ref 0.7–4.0)
Lymphocytes Relative: 32 %
MCH: 29.9 pg (ref 26.0–34.0)
MCHC: 33.2 g/dL (ref 30.0–36.0)
MCV: 90.2 fL (ref 78.0–100.0)
MONOS PCT: 9 %
Monocytes Absolute: 0.5 10*3/uL (ref 0.1–1.0)
NEUTROS ABS: 3.1 10*3/uL (ref 1.7–7.7)
NEUTROS PCT: 58 %
Platelets: 325 10*3/uL (ref 150–400)
RBC: 4.18 MIL/uL (ref 3.87–5.11)
RDW: 14.4 % (ref 11.5–15.5)
WBC: 5.4 10*3/uL (ref 4.0–10.5)

## 2015-12-10 ENCOUNTER — Ambulatory Visit
Admission: RE | Admit: 2015-12-10 | Discharge: 2015-12-10 | Disposition: A | Discharge status: Home / Self Care | Payer: Commercial Managed Care - HMO | Source: Ambulatory Visit | Attending: General Surgery | Admitting: General Surgery

## 2015-12-10 ENCOUNTER — Ambulatory Visit (HOSPITAL_BASED_OUTPATIENT_CLINIC_OR_DEPARTMENT_OTHER): Payer: Commercial Managed Care - HMO | Admitting: Certified Registered"

## 2015-12-10 ENCOUNTER — Encounter (HOSPITAL_BASED_OUTPATIENT_CLINIC_OR_DEPARTMENT_OTHER): Payer: Self-pay | Admitting: Certified Registered"

## 2015-12-10 ENCOUNTER — Encounter (HOSPITAL_BASED_OUTPATIENT_CLINIC_OR_DEPARTMENT_OTHER)
Admission: RE | Disposition: A | Discharge status: Home / Self Care | Payer: Self-pay | Source: Ambulatory Visit | Attending: General Surgery

## 2015-12-10 ENCOUNTER — Other Ambulatory Visit: Payer: Self-pay | Admitting: General Surgery

## 2015-12-10 ENCOUNTER — Ambulatory Visit (HOSPITAL_BASED_OUTPATIENT_CLINIC_OR_DEPARTMENT_OTHER)
Admission: RE | Admit: 2015-12-10 | Discharge: 2015-12-10 | Disposition: A | Discharge status: Home / Self Care | Payer: Commercial Managed Care - HMO | Source: Ambulatory Visit | Attending: General Surgery | Admitting: General Surgery

## 2015-12-10 DIAGNOSIS — N6099 Unspecified benign mammary dysplasia of unspecified breast: Secondary | ICD-10-CM

## 2015-12-10 DIAGNOSIS — Z791 Long term (current) use of non-steroidal anti-inflammatories (NSAID): Secondary | ICD-10-CM | POA: Insufficient documentation

## 2015-12-10 DIAGNOSIS — N6092 Unspecified benign mammary dysplasia of left breast: Secondary | ICD-10-CM | POA: Insufficient documentation

## 2015-12-10 DIAGNOSIS — Z803 Family history of malignant neoplasm of breast: Secondary | ICD-10-CM | POA: Insufficient documentation

## 2015-12-10 DIAGNOSIS — N63 Unspecified lump in breast: Secondary | ICD-10-CM | POA: Diagnosis present

## 2015-12-10 DIAGNOSIS — N6022 Fibroadenosis of left breast: Secondary | ICD-10-CM | POA: Diagnosis not present

## 2015-12-10 HISTORY — DX: Tinea unguium: B35.1

## 2015-12-10 HISTORY — PX: RADIOACTIVE SEED GUIDED EXCISIONAL BREAST BIOPSY: SHX6490

## 2015-12-10 SURGERY — RADIOACTIVE SEED GUIDED BREAST BIOPSY
Anesthesia: General | Site: Breast | Laterality: Left

## 2015-12-10 MED ORDER — ACETAMINOPHEN 160 MG/5ML PO SOLN: 325.0000 mg | ORAL | Status: DC | PRN

## 2015-12-10 MED ORDER — MIDAZOLAM HCL 5 MG/5ML IJ SOLN
INTRAMUSCULAR | Status: DC | PRN
  Administered 2015-12-10: 2 mg via INTRAVENOUS

## 2015-12-10 MED ORDER — CEFAZOLIN SODIUM-DEXTROSE 2-3 GM-% IV SOLR
INTRAVENOUS | Status: AC
  Filled 2015-12-10: qty 50

## 2015-12-10 MED ORDER — OXYCODONE HCL 5 MG/5ML PO SOLN: 5.0000 mg | Freq: Once | ORAL | Status: DC | PRN

## 2015-12-10 MED ORDER — LIDOCAINE HCL (CARDIAC) 20 MG/ML IV SOLN
INTRAVENOUS | Status: DC | PRN
  Administered 2015-12-10: 60 mg via INTRAVENOUS

## 2015-12-10 MED ORDER — FENTANYL CITRATE (PF) 100 MCG/2ML IJ SOLN
INTRAMUSCULAR | Status: DC | PRN
  Administered 2015-12-10 (×2): 50 ug via INTRAVENOUS

## 2015-12-10 MED ORDER — MIDAZOLAM HCL 2 MG/2ML IJ SOLN: 1.0000 mg | INTRAMUSCULAR | Status: DC | PRN

## 2015-12-10 MED ORDER — OXYCODONE HCL 5 MG PO TABS: 5.0000 mg | ORAL_TABLET | Freq: Once | ORAL | Status: DC | PRN

## 2015-12-10 MED ORDER — FENTANYL CITRATE (PF) 100 MCG/2ML IJ SOLN: 25.0000 ug | INTRAMUSCULAR | Status: DC | PRN

## 2015-12-10 MED ORDER — DEXAMETHASONE SODIUM PHOSPHATE 10 MG/ML IJ SOLN
INTRAMUSCULAR | Status: AC
  Filled 2015-12-10: qty 1

## 2015-12-10 MED ORDER — OXYCODONE-ACETAMINOPHEN 10-325 MG PO TABS: 1.0000 | ORAL_TABLET | Freq: Four times a day (QID) | ORAL | Status: AC | PRN

## 2015-12-10 MED ORDER — ONDANSETRON HCL 4 MG/2ML IJ SOLN
INTRAMUSCULAR | Status: DC | PRN
  Administered 2015-12-10: 4 mg via INTRAVENOUS

## 2015-12-10 MED ORDER — FENTANYL CITRATE (PF) 100 MCG/2ML IJ SOLN
INTRAMUSCULAR | Status: AC
  Filled 2015-12-10: qty 2

## 2015-12-10 MED ORDER — ONDANSETRON HCL 4 MG/2ML IJ SOLN
INTRAMUSCULAR | Status: AC
  Filled 2015-12-10: qty 2

## 2015-12-10 MED ORDER — CEFAZOLIN SODIUM-DEXTROSE 2-3 GM-% IV SOLR
2.0000 g | INTRAVENOUS | Status: AC
  Administered 2015-12-10: 2 g via INTRAVENOUS

## 2015-12-10 MED ORDER — ACETAMINOPHEN 325 MG PO TABS: 325.0000 mg | ORAL_TABLET | ORAL | Status: DC | PRN

## 2015-12-10 MED ORDER — PROPOFOL 10 MG/ML IV BOLUS
INTRAVENOUS | Status: AC
  Filled 2015-12-10: qty 20

## 2015-12-10 MED ORDER — HYDROMORPHONE HCL 1 MG/ML IJ SOLN: 0.2500 mg | INTRAMUSCULAR | Status: DC | PRN

## 2015-12-10 MED ORDER — BUPIVACAINE HCL (PF) 0.25 % IJ SOLN
INTRAMUSCULAR | Status: DC | PRN
  Administered 2015-12-10: 10 mL

## 2015-12-10 MED ORDER — LIDOCAINE HCL (CARDIAC) 20 MG/ML IV SOLN
INTRAVENOUS | Status: AC
  Filled 2015-12-10: qty 5

## 2015-12-10 MED ORDER — LACTATED RINGERS IV SOLN
INTRAVENOUS | Status: DC
  Administered 2015-12-10 (×2): via INTRAVENOUS

## 2015-12-10 MED ORDER — PROPOFOL 10 MG/ML IV BOLUS
INTRAVENOUS | Status: DC | PRN
  Administered 2015-12-10: 140 mg via INTRAVENOUS

## 2015-12-10 MED ORDER — GLYCOPYRROLATE 0.2 MG/ML IJ SOLN: 0.2000 mg | Freq: Once | INTRAMUSCULAR | Status: DC | PRN

## 2015-12-10 MED ORDER — SCOPOLAMINE 1 MG/3DAYS TD PT72: 1.0000 | MEDICATED_PATCH | Freq: Once | TRANSDERMAL | Status: DC | PRN

## 2015-12-10 MED ORDER — FENTANYL CITRATE (PF) 100 MCG/2ML IJ SOLN: 50.0000 ug | INTRAMUSCULAR | Status: DC | PRN

## 2015-12-10 MED ORDER — MIDAZOLAM HCL 2 MG/2ML IJ SOLN
INTRAMUSCULAR | Status: AC
  Filled 2015-12-10: qty 2

## 2015-12-10 MED ORDER — DEXAMETHASONE SODIUM PHOSPHATE 4 MG/ML IJ SOLN
INTRAMUSCULAR | Status: DC | PRN
  Administered 2015-12-10: 10 mg via INTRAVENOUS

## 2015-12-10 SURGICAL SUPPLY — 61 items
APPLIER CLIP 9.375 MED OPEN (MISCELLANEOUS)
APR CLP MED 9.3 20 MLT OPN (MISCELLANEOUS)
BINDER BREAST LRG (GAUZE/BANDAGES/DRESSINGS) IMPLANT
BINDER BREAST MEDIUM (GAUZE/BANDAGES/DRESSINGS) IMPLANT
BINDER BREAST XLRG (GAUZE/BANDAGES/DRESSINGS) ×1 IMPLANT
BINDER BREAST XXLRG (GAUZE/BANDAGES/DRESSINGS) IMPLANT
BLADE SURG 15 STRL LF DISP TIS (BLADE) ×1 IMPLANT
BLADE SURG 15 STRL SS (BLADE) ×2
CANISTER SUC SOCK COL 7IN (MISCELLANEOUS) IMPLANT
CANISTER SUCT 1200ML W/VALVE (MISCELLANEOUS) IMPLANT
CHLORAPREP W/TINT 26ML (MISCELLANEOUS) ×2 IMPLANT
CLIP APPLIE 9.375 MED OPEN (MISCELLANEOUS) IMPLANT
COVER BACK TABLE 60X90IN (DRAPES) ×2 IMPLANT
COVER MAYO STAND STRL (DRAPES) ×2 IMPLANT
COVER PROBE W GEL 5X96 (DRAPES) ×2 IMPLANT
DECANTER SPIKE VIAL GLASS SM (MISCELLANEOUS) ×1 IMPLANT
DEVICE DUBIN W/COMP PLATE 8390 (MISCELLANEOUS) ×2 IMPLANT
DRAPE LAPAROSCOPIC ABDOMINAL (DRAPES) ×2 IMPLANT
DRAPE UTILITY XL STRL (DRAPES) ×2 IMPLANT
DRSG TEGADERM 4X4.75 (GAUZE/BANDAGES/DRESSINGS) IMPLANT
ELECT COATED BLADE 2.86 ST (ELECTRODE) ×2 IMPLANT
ELECT REM PT RETURN 9FT ADLT (ELECTROSURGICAL) ×2
ELECTRODE REM PT RTRN 9FT ADLT (ELECTROSURGICAL) ×1 IMPLANT
GLOVE BIO SURGEON STRL SZ 6.5 (GLOVE) ×2 IMPLANT
GLOVE BIO SURGEON STRL SZ7 (GLOVE) ×4 IMPLANT
GLOVE BIOGEL PI IND STRL 7.0 (GLOVE) IMPLANT
GLOVE BIOGEL PI IND STRL 7.5 (GLOVE) ×1 IMPLANT
GLOVE BIOGEL PI INDICATOR 7.0 (GLOVE) ×2
GLOVE BIOGEL PI INDICATOR 7.5 (GLOVE) ×1
GLOVE EXAM NITRILE EXT CUFF MD (GLOVE) ×1 IMPLANT
GOWN STRL REUS W/ TWL LRG LVL3 (GOWN DISPOSABLE) ×2 IMPLANT
GOWN STRL REUS W/TWL LRG LVL3 (GOWN DISPOSABLE) ×6
ILLUMINATOR WAVEGUIDE N/F (MISCELLANEOUS) IMPLANT
KIT MARKER MARGIN INK (KITS) ×2 IMPLANT
LIGHT WAVEGUIDE WIDE FLAT (MISCELLANEOUS) IMPLANT
LIQUID BAND (GAUZE/BANDAGES/DRESSINGS) ×2 IMPLANT
MARKER SKIN DUAL TIP RULER LAB (MISCELLANEOUS) ×2 IMPLANT
NDL HYPO 25X1 1.5 SAFETY (NEEDLE) ×1 IMPLANT
NEEDLE HYPO 25X1 1.5 SAFETY (NEEDLE) ×2 IMPLANT
NS IRRIG 1000ML POUR BTL (IV SOLUTION) ×1 IMPLANT
PACK BASIN DAY SURGERY FS (CUSTOM PROCEDURE TRAY) ×2 IMPLANT
PENCIL BUTTON HOLSTER BLD 10FT (ELECTRODE) ×2 IMPLANT
SHEET MEDIUM DRAPE 40X70 STRL (DRAPES) IMPLANT
SLEEVE SCD COMPRESS KNEE MED (MISCELLANEOUS) ×2 IMPLANT
SPONGE GAUZE 4X4 12PLY STER LF (GAUZE/BANDAGES/DRESSINGS) IMPLANT
SPONGE LAP 4X18 X RAY DECT (DISPOSABLE) ×2 IMPLANT
STAPLER VISISTAT 35W (STAPLE) IMPLANT
STRIP CLOSURE SKIN 1/2X4 (GAUZE/BANDAGES/DRESSINGS) ×2 IMPLANT
SUT MNCRL AB 4-0 PS2 18 (SUTURE) ×1 IMPLANT
SUT MON AB 5-0 PS2 18 (SUTURE) IMPLANT
SUT SILK 2 0 SH (SUTURE) IMPLANT
SUT VIC AB 2-0 SH 27 (SUTURE) ×2
SUT VIC AB 2-0 SH 27XBRD (SUTURE) ×1 IMPLANT
SUT VIC AB 3-0 SH 27 (SUTURE) ×2
SUT VIC AB 3-0 SH 27X BRD (SUTURE) ×1 IMPLANT
SUT VIC AB 5-0 PS2 18 (SUTURE) IMPLANT
SYR CONTROL 10ML LL (SYRINGE) ×2 IMPLANT
TOWEL OR 17X24 6PK STRL BLUE (TOWEL DISPOSABLE) ×2 IMPLANT
TOWEL OR NON WOVEN STRL DISP B (DISPOSABLE) ×1 IMPLANT
TUBE CONNECTING 20X1/4 (TUBING) IMPLANT
YANKAUER SUCT BULB TIP NO VENT (SUCTIONS) IMPLANT

## 2015-12-10 NOTE — Op Note (Addendum)
Preoperative diagnosis: Left breast mass, core biopsy with ADH and ALH Postoperative diagnosis: same as above Procedure: left breast mass seed guided excisional biopsy Surgeon: Dr Serita Grammes Anesthesia: general EBL: minimal Drains none Complications none Specimen left breast tissue marked with paint, seed separate, additional anterior and medial margins marked short superior, long lateral double deep Sponge and needle count was correct to completion Suspicion to recovery stable  Indications: This is a 62 yof with left breast mass and core biopsy consistent with ADH. We discussed options. She and I discussed seed guided excision of this mass. She had a seed placed prior to beginning and I had her mm in the OR.   Procedure: After informed consent was obtained the patient was taken to the operating room. She was given antibiotics. Sequential compression devices were on her legs. She was then placed under general anesthesia without complication. She was prepped and draped in the standard sterile surgical fashion. A surgical timeout was then performed.  I infiltrated 1% lidocaine and made an axillary incision after locating the seed. I used the lighted retractors to tunnel to the seed. I then removed the seed and the surrounding tissue.I sent the seed separately as it floated out of hematoma cavity. I think the clip likely did as well. I clearly noted the biopsy cavity grossly. There were a couple other margins with abnormal appearing tissue that I removed as above. Hemostasis was observed. I painted the specimen. Mammogram confirmed removal of the seed. The specimen mammogram did not contain the clip but I think this came out of cavity. I then closed this with 2-0 Vicryl, 3-0 Vicryl, and 4-0 Monocryl. Glue was placed over the wound.Breast binder was placed. She tolerated this well was extubated and transferred to recovery in stable condition

## 2015-12-10 NOTE — H&P (Signed)
63 yof with family history of breast cancer who has no personal history who underwent screening mm (no masses or discharge) that showed density c breasts. she has possible mass and calcs in the left breast on screening tomo mag views shows upper inner quadrant group of faint calcs measuring 3.4x2.1 cm. there is second group that measures 4.5x3.5 cm in uoq. US shows no breast abnormality in uiq and no adenopathy in axilla. biopsy of uiq is fibrocystic changes and of uoq is foci of adh and fcc.    Other Problems No pertinent past medical history  Past Surgical History Appendectomy Breast Biopsy Bilateral. multiple Hysterectomy (not due to cancer) - Complete Oral Surgery  Diagnostic Studies History  Colonoscopy 5-10 years ago Mammogram within last year Pap Smear >5 years ago  Allergies  Phenergan *ANTIHISTAMINES* When Phenergan and Demerol mixed together pt went into a light coma after sx Demerol *ANALGESICS - OPIOID* When Demerol and Phenergan mixed together pt went into a light coma after sx  Medication History  No Current Medications Medications Reconciled  Social History  Alcohol use Occasional alcohol use. Caffeine use Coffee. No drug use Tobacco use Never smoker.  Family History  Alcohol Abuse Brother, Father. Heart Disease Mother. Malignant Neoplasm Of Pancreas Father.  Pregnancy / Birth History  Age at menarche 75 years. Age of menopause 50-50 Gravida 0  Review of Systems  General Not Present- Appetite Loss, Chills, Fatigue, Fever, Night Sweats, Weight Gain and Weight Loss. Skin Not Present- Change in Wart/Mole, Dryness, Hives, Jaundice, New Lesions, Non-Healing Wounds, Rash and Ulcer. HEENT Not Present- Earache, Hearing Loss, Hoarseness, Nose Bleed, Oral Ulcers, Ringing in the Ears, Seasonal Allergies, Sinus Pain, Sore Throat, Visual Disturbances, Wears glasses/contact lenses and Yellow Eyes. Breast Not Present- Breast Mass, Breast Pain,  Nipple Discharge and Skin Changes. Cardiovascular Not Present- Chest Pain, Difficulty Breathing Lying Down, Leg Cramps, Palpitations, Rapid Heart Rate, Shortness of Breath and Swelling of Extremities. Gastrointestinal Not Present- Abdominal Pain, Bloating, Bloody Stool, Change in Bowel Habits, Chronic diarrhea, Constipation, Difficulty Swallowing, Excessive gas, Gets full quickly at meals, Hemorrhoids, Indigestion, Nausea, Rectal Pain and Vomiting. Female Genitourinary Not Present- Frequency, Nocturia, Painful Urination, Pelvic Pain and Urgency. Musculoskeletal Not Present- Back Pain, Joint Pain, Joint Stiffness, Muscle Pain, Muscle Weakness and Swelling of Extremities. Neurological Not Present- Decreased Memory, Fainting, Headaches, Numbness, Seizures, Tingling, Tremor, Trouble walking and Weakness. Psychiatric Not Present- Anxiety, Bipolar, Change in Sleep Pattern, Depression, Fearful and Frequent crying. Endocrine Not Present- Cold Intolerance, Excessive Hunger, Hair Changes, Heat Intolerance, Hot flashes and New Diabetes. Hematology Not Present- Easy Bruising, Excessive bleeding, Gland problems, HIV and Persistent Infections.  Vitals  Weight: 168 lb Height: 62in Body Surface Area: 1.77 m Body Mass Index: 30.73 kg/m  Pulse: 68 (Regular)  BP: 110/68 (Sitting, Left Arm, Standard) Physical Exam  General Mental Status-Alert. Orientation-Oriented X3. Chest and Lung Exam Chest and lung exam reveals -on auscultation, normal breath sounds, no adventitious sounds and normal vocal resonance. Breast Nipples-No Discharge. Breast Lump-No Palpable Breast Mass. Cardiovascular Cardiovascular examination reveals -normal heart sounds, regular rate and rhythm with no murmurs. Lymphatic Head & Neck General Head & Neck Lymphatics: Bilateral - Description - Normal. Axillary General Axillary Region: Bilateral - Description - Normal. Note: no [place] adenopathy  Assessment &  Plan ATYPICAL DUCTAL HYPERPLASIA OF LEFT BREAST (N60.92) Story: Left breast seed guided excisional biopsy discussed possible upgrade of path given adh. discussed surgery as well as recovery, possible further surgery and oncology consult for risk reduction.

## 2015-12-10 NOTE — Interval H&P Note (Signed)
History and Physical Interval Note:  12/10/2015 11:37 AM  Lynnae Sandhoff  has presented today for surgery, with the diagnosis of LEFT BREAST ADH  The various methods of treatment have been discussed with the patient and family. After consideration of risks, benefits and other options for treatment, the patient has consented to  Procedure(s): LEFT BREAST RADIOACTIVE SEED GUIDED EXCISIONAL BIOPSY (Left) as a surgical intervention .  The patient's history has been reviewed, patient examined, no change in status, stable for surgery.  I have reviewed the patient's chart and labs.  Questions were answered to the patient's satisfaction.     Areg Bialas

## 2015-12-10 NOTE — Anesthesia Procedure Notes (Signed)
Procedure Name: LMA Insertion Date/Time: 12/10/2015 11:58 AM Performed by: Baxter Flattery Pre-anesthesia Checklist: Patient identified, Emergency Drugs available, Suction available and Patient being monitored Patient Re-evaluated:Patient Re-evaluated prior to inductionOxygen Delivery Method: Circle System Utilized Preoxygenation: Pre-oxygenation with 100% oxygen Intubation Type: IV induction Ventilation: Mask ventilation without difficulty LMA: LMA inserted LMA Size: 4.0 Number of attempts: 1 Airway Equipment and Method: Bite block Placement Confirmation: positive ETCO2 and breath sounds checked- equal and bilateral Tube secured with: Tape Dental Injury: Teeth and Oropharynx as per pre-operative assessment

## 2015-12-10 NOTE — Transfer of Care (Signed)
Immediate Anesthesia Transfer of Care Note  Patient: Crystal [name]  Procedure(s) Performed: Procedure(s): LEFT BREAST RADIOACTIVE SEED GUIDED EXCISIONAL BIOPSY (Left)  Patient Location: PACU  Anesthesia Type:General  Level of Consciousness: awake, alert  and oriented  Airway & Oxygen Therapy: Patient Spontanous Breathing and Patient connected to face mask oxygen  Post-op Assessment: Report given to RN, Post -op Vital signs reviewed and stable and Patient moving all extremities  Post vital signs: Reviewed and stable  Last Vitals:  Filed Vitals:   12/10/15 1102  BP: 115/75  Pulse: 82  Temp: 36.7 C  Resp: 18    Complications: No apparent anesthesia complications

## 2015-12-10 NOTE — Anesthesia Preprocedure Evaluation (Addendum)
Anesthesia Evaluation  Patient identified by MRN, date of birth, ID band Patient awake    Reviewed: Allergy & Precautions, NPO status , Patient's Chart, lab work & pertinent test results  History of Anesthesia Complications Negative for: history of anesthetic complications  Airway Mallampati: II  TM Distance: >3 FB Neck ROM: Full    Dental  (+) Teeth Intact   Pulmonary neg pulmonary ROS,    breath sounds clear to auscultation       Cardiovascular negative cardio ROS   Rhythm:Regular     Neuro/Psych  Headaches, negative psych ROS   GI/Hepatic negative GI ROS, Neg liver ROS,   Endo/Other  negative endocrine ROS  Renal/GU negative Renal ROS     Musculoskeletal   Abdominal   Peds  Hematology negative hematology ROS (+)   Anesthesia Other Findings   Reproductive/Obstetrics                             Anesthesia Physical Anesthesia Plan  ASA: II  Anesthesia Plan: General   Post-op Pain Management:    Induction: Intravenous  Airway Management Planned: LMA  Additional Equipment: None  Intra-op Plan:   Post-operative Plan: Extubation in OR  Informed Consent: I have reviewed the patients History and Physical, chart, labs and discussed the procedure including the risks, benefits and alternatives for the proposed anesthesia with the patient or authorized representative who has indicated his/her understanding and acceptance.   Dental advisory given  Plan Discussed with: Surgeon and CRNA  Anesthesia Plan Comments:        Anesthesia Quick Evaluation

## 2015-12-10 NOTE — Discharge Instructions (Signed)
[facility] °Office Phone Number [phone] °POST OP INSTRUCTIONS ° °Always review your discharge instruction sheet given to you by the facility where your surgery was performed. ° °IF YOU HAVE DISABILITY OR FAMILY LEAVE FORMS, YOU MUST BRING THEM TO THE OFFICE FOR PROCESSING.  DO NOT GIVE THEM TO YOUR DOCTOR. ° °1. A prescription for pain medication may be given to you upon discharge.  Take your pain medication as prescribed, if needed.  If narcotic pain medicine is not needed, then you may take acetaminophen (Tylenol), naprosyn (Alleve) or ibuprofen (Advil) as needed. °2. Take your usually prescribed medications unless otherwise directed °3. If you need a refill on your pain medication, please contact your pharmacy.  They will contact our office to request authorization.  Prescriptions will not be filled after 5pm or on week-ends. °4. You should eat very light the first 24 hours after surgery, such as soup, crackers, pudding, etc.  Resume your normal diet the day after surgery. °5. Most patients will experience some swelling and bruising in the breast.  Ice packs and a good support bra will help.  Wear the breast binder provided or a sports bra for 72 hours day and night.  After that wear a sports bra during the day until you return to the office. Swelling and bruising can take several days to resolve.  °6. It is common to experience some constipation if taking pain medication after surgery.  Increasing fluid intake and taking a stool softener will usually help or prevent this problem from occurring.  A mild laxative (Milk of Magnesia or Miralax) should be taken according to package directions if there are no bowel movements after 48 hours. °7. Unless discharge instructions indicate otherwise, you may remove your bandages 48 hours after surgery and you may shower at that time.  You may have steri-strips (small skin tapes) in place directly over the incision.  These strips should be left on the  skin for 7-10 days and will come off on their own.  If your surgeon used skin glue on the incision, you may shower in 24 hours.  The glue will flake off over the next 2-3 weeks.  Any sutures or staples will be removed at the office during your follow-up visit. °8. ACTIVITIES:  You may resume regular daily activities (gradually increasing) beginning the next day.  Wearing a good support bra or sports bra minimizes pain and swelling.  You may have sexual intercourse when it is comfortable. °a. You may drive when you no longer are taking prescription pain medication, you can comfortably wear a seatbelt, and you can safely maneuver your car and apply brakes. °b. RETURN TO WORK:  ______________________________________________________________________________________ °9. You should see your doctor in the office for a follow-up appointment approximately two weeks after your surgery.  Your doctor’s nurse will typically make your follow-up appointment when she calls you with your pathology report.  Expect your pathology report 3-4 business days after your surgery.  You may call to check if you do not hear from us after three days. °10. OTHER INSTRUCTIONS: _______________________________________________________________________________________________ _____________________________________________________________________________________________________________________________________ °_____________________________________________________________________________________________________________________________________ °_____________________________________________________________________________________________________________________________________ ° °WHEN TO CALL DR WAKEFIELD: °1. Fever over 101.0 °2. Nausea and/or vomiting. °3. Extreme swelling or bruising. °4. Continued bleeding from incision. °5. Increased pain, redness, or drainage from the incision. ° °The clinic staff is available to answer your questions during regular  business hours.  Please don’t hesitate to call and ask to speak to one of the nurses for clinical concerns.  If you   If you have a medical emergency, go to the nearest emergency room or call 911.  A surgeon from [facility] is always on call at the hospital. ° °For further questions, please visit [email or web address] ° ° °Post Anesthesia Home Care Instructions ° °Activity: °Get plenty of rest for the remainder of the day. A responsible adult should stay with you for 24 hours following the procedure.  °For the next 24 hours, DO NOT: °-Drive a car °-Operate machinery °-Drink alcoholic beverages °-Take any medication unless instructed by your physician °-Make any legal decisions or sign important papers. ° °Meals: °Start with liquid foods such as gelatin or soup. Progress to regular foods as tolerated. Avoid greasy, spicy, heavy foods. If nausea and/or vomiting occur, drink only clear liquids until the nausea and/or vomiting subsides. Call your physician if vomiting continues. ° °Special Instructions/Symptoms: °Your throat may feel dry or sore from the anesthesia or the breathing tube placed in your throat during surgery. If this causes discomfort, gargle with warm salt water. The discomfort should disappear within 24 hours. ° °

## 2015-12-10 NOTE — Anesthesia Postprocedure Evaluation (Signed)
Anesthesia Post Note  Patient: Crystal [name]  Procedure(s) Performed: Procedure(s) (LRB): LEFT BREAST RADIOACTIVE SEED GUIDED EXCISIONAL BIOPSY (Left)  Patient location during evaluation: PACU Anesthesia Type: General Level of consciousness: awake Pain management: pain level controlled Vital Signs Assessment: post-procedure vital signs reviewed and stable Respiratory status: spontaneous breathing Cardiovascular status: stable Postop Assessment: no signs of nausea or vomiting Anesthetic complications: no    Last Vitals:  Filed Vitals:   12/10/15 1300 12/10/15 1315  BP: 93/50 117/73  Pulse: 75 79  Temp:  36.5 C  Resp: 19 18    Last Pain: There were no vitals filed for this visit.               Shakenya Stoneberg

## 2015-12-11 ENCOUNTER — Encounter (HOSPITAL_BASED_OUTPATIENT_CLINIC_OR_DEPARTMENT_OTHER): Payer: Self-pay | Admitting: General Surgery

## 2016-01-08 DIAGNOSIS — N6099 Unspecified benign mammary dysplasia of unspecified breast: Secondary | ICD-10-CM | POA: Insufficient documentation

## 2016-01-08 HISTORY — DX: Unspecified benign mammary dysplasia of unspecified breast: N60.99

## 2016-12-01 DIAGNOSIS — R05 Cough: Secondary | ICD-10-CM | POA: Diagnosis not present

## 2017-01-27 DIAGNOSIS — N952 Postmenopausal atrophic vaginitis: Secondary | ICD-10-CM | POA: Diagnosis not present

## 2017-01-27 DIAGNOSIS — Z01419 Encounter for gynecological examination (general) (routine) without abnormal findings: Secondary | ICD-10-CM | POA: Diagnosis not present

## 2017-01-29 ENCOUNTER — Other Ambulatory Visit: Payer: Self-pay | Admitting: Obstetrics and Gynecology

## 2017-01-29 DIAGNOSIS — Z1231 Encounter for screening mammogram for malignant neoplasm of breast: Secondary | ICD-10-CM

## 2017-02-19 ENCOUNTER — Ambulatory Visit: Payer: Commercial Managed Care - HMO

## 2017-03-01 ENCOUNTER — Ambulatory Visit: Payer: Commercial Managed Care - HMO

## 2017-03-01 ENCOUNTER — Ambulatory Visit
Admission: RE | Admit: 2017-03-01 | Discharge: 2017-03-01 | Disposition: A | Discharge status: Home / Self Care | Payer: Commercial Managed Care - HMO | Source: Ambulatory Visit | Attending: Obstetrics and Gynecology | Admitting: Obstetrics and Gynecology

## 2017-03-01 DIAGNOSIS — Z1231 Encounter for screening mammogram for malignant neoplasm of breast: Secondary | ICD-10-CM | POA: Diagnosis not present

## 2017-05-26 DIAGNOSIS — M79671 Pain in right foot: Secondary | ICD-10-CM | POA: Diagnosis not present

## 2017-05-26 DIAGNOSIS — G5762 Lesion of plantar nerve, left lower limb: Secondary | ICD-10-CM | POA: Diagnosis not present

## 2017-05-26 DIAGNOSIS — M25572 Pain in left ankle and joints of left foot: Secondary | ICD-10-CM | POA: Diagnosis not present

## 2017-06-22 DIAGNOSIS — M25572 Pain in left ankle and joints of left foot: Secondary | ICD-10-CM | POA: Diagnosis not present

## 2017-06-22 DIAGNOSIS — M79672 Pain in left foot: Secondary | ICD-10-CM | POA: Diagnosis not present

## 2017-06-22 DIAGNOSIS — M79671 Pain in right foot: Secondary | ICD-10-CM | POA: Diagnosis not present

## 2017-09-06 ENCOUNTER — Encounter: Payer: Self-pay | Admitting: Internal Medicine

## 2017-09-28 DIAGNOSIS — Z23 Encounter for immunization: Secondary | ICD-10-CM | POA: Diagnosis not present

## 2017-10-28 DIAGNOSIS — Z13220 Encounter for screening for lipoid disorders: Secondary | ICD-10-CM | POA: Diagnosis not present

## 2017-10-28 DIAGNOSIS — Z0000 Encounter for general adult medical examination without abnormal findings: Secondary | ICD-10-CM | POA: Diagnosis not present

## 2017-12-21 ENCOUNTER — Encounter: Payer: Self-pay | Admitting: Internal Medicine

## 2018-01-11 DIAGNOSIS — L237 Allergic contact dermatitis due to plants, except food: Secondary | ICD-10-CM | POA: Diagnosis not present

## 2018-02-11 ENCOUNTER — Encounter: Payer: Self-pay | Admitting: Internal Medicine

## 2018-02-11 ENCOUNTER — Other Ambulatory Visit: Payer: Self-pay

## 2018-02-11 ENCOUNTER — Ambulatory Visit (AMBULATORY_SURGERY_CENTER): Payer: Self-pay | Admitting: *Deleted

## 2018-02-11 VITALS — Ht 62.0 in | Wt 174.0 lb

## 2018-02-11 DIAGNOSIS — Z1211 Encounter for screening for malignant neoplasm of colon: Secondary | ICD-10-CM

## 2018-02-11 MED ORDER — NA SULFATE-K SULFATE-MG SULF 17.5-3.13-1.6 GM/177ML PO SOLN: 1.0000 | Freq: Once | ORAL | 0 refills | Status: AC

## 2018-02-11 NOTE — Progress Notes (Signed)
No egg or soy allergy known to patient  No issues with past sedation with any surgeries  or procedures, no intubation problems  No diet pills per patient No home 02 use per patient  No blood thinners per patient  Pt denies issues with constipation  No A fib or A flutter  EMMI video sent to pt's e mail -pt declined  Pt given a $15 coupon in PV today for suprep

## 2018-02-25 ENCOUNTER — Other Ambulatory Visit: Payer: Self-pay

## 2018-02-25 ENCOUNTER — Ambulatory Visit (AMBULATORY_SURGERY_CENTER): Payer: 59 | Admitting: Internal Medicine

## 2018-02-25 ENCOUNTER — Encounter: Payer: Self-pay | Admitting: Internal Medicine

## 2018-02-25 VITALS — BP 107/71 | HR 63 | Temp 98.6°F | Resp 15 | Ht 62.0 in | Wt 174.0 lb

## 2018-02-25 DIAGNOSIS — D120 Benign neoplasm of cecum: Secondary | ICD-10-CM | POA: Diagnosis not present

## 2018-02-25 DIAGNOSIS — Z1212 Encounter for screening for malignant neoplasm of rectum: Secondary | ICD-10-CM

## 2018-02-25 DIAGNOSIS — D122 Benign neoplasm of ascending colon: Secondary | ICD-10-CM | POA: Diagnosis not present

## 2018-02-25 DIAGNOSIS — Z1211 Encounter for screening for malignant neoplasm of colon: Secondary | ICD-10-CM

## 2018-02-25 DIAGNOSIS — D123 Benign neoplasm of transverse colon: Secondary | ICD-10-CM

## 2018-02-25 MED ORDER — SODIUM CHLORIDE 0.9 % IV SOLN: 500.0000 mL | Freq: Once | INTRAVENOUS | Status: DC

## 2018-02-25 NOTE — Op Note (Signed)
[facility] Patient Name: Crystal [name] Procedure Date: 02/25/2018 11:57 AM MRN: [id number] Endoscopist: Docia Chuck. Henrene Pastor , MD Age: 62 Referring MD:  Date of Birth: 1956-04-16 Gender: Female Account #: [id number] Procedure:                Colonoscopy, with cold snare polypectomy x 4 Indications:              Screening for colorectal malignant neoplasm.                            Negative index exam 2008 Medicines:                Monitored Anesthesia Care Procedure:                Pre-Anesthesia Assessment:                           - Prior to the procedure, a History and Physical                            was performed, and patient medications and                            allergies were reviewed. The patient's tolerance of                            previous anesthesia was also reviewed. The risks                            and benefits of the procedure and the sedation                            options and risks were discussed with the patient.                            All questions were answered, and informed consent                            was obtained. Prior Anticoagulants: The patient has                            taken no previous anticoagulant or antiplatelet                            agents. ASA Grade Assessment: II - A patient with                            mild systemic disease. After reviewing the risks                            and benefits, the patient was deemed in                            satisfactory condition to undergo the procedure.  After obtaining informed consent, the colonoscope                            was passed under direct vision. Throughout the                            procedure, the patient's blood pressure, pulse, and                            oxygen saturations were monitored continuously. The                            Colonoscope was introduced through the anus and                            advanced to  the the cecum, identified by                            appendiceal orifice and ileocecal valve. The                            ileocecal valve, appendiceal orifice, and rectum                            were photographed. The quality of the bowel                            preparation was good. The colonoscopy was performed                            without difficulty. The patient tolerated the                            procedure well. The bowel preparation used was                            SUPREP. Scope In: 12:08:19 PM Scope Out: 12:30:03 PM Scope Withdrawal Time: 0 hours 15 minutes 2 seconds  Total Procedure Duration: 0 hours 21 minutes 44 seconds  Findings:                 Four polyps were found in the transverse colon,                            ascending colon and cecum. The polyps were 2 to 3                            mm in size. These polyps were removed with a cold                            snare. Resection and retrieval were complete.                           Multiple diverticula were found in the left colon.  The exam was otherwise without abnormality on                            direct and retroflexion views. Complications:            No immediate complications. Estimated blood loss:                            None. Estimated Blood Loss:     Estimated blood loss: none. Impression:               - Four 2 to 3 mm polyps in the transverse colon, in                            the ascending colon and in the cecum, removed with                            a cold snare. Resected and retrieved.                           - Diverticulosis in the left colon.                           - The examination was otherwise normal on direct                            and retroflexion views. Recommendation:           - Repeat colonoscopy in 5 or 10 years for                            surveillance, based on pathology results.                           - Patient  has a contact number available for                            emergencies. The signs and symptoms of potential                            delayed complications were discussed with the                            patient. Return to normal activities tomorrow.                            Written discharge instructions were provided to the                            patient.                           - Resume previous diet.                           - Continue present medications.                           -  Await pathology results. Docia Chuck. Henrene Pastor, MD 02/25/2018 12:37:18 PM This report has been signed electronically.

## 2018-02-25 NOTE — Patient Instructions (Signed)
Discharge instructions given. Handouts on polyps and diverticulosis. Resume previous medications. YOU HAD AN ENDOSCOPIC PROCEDURE TODAY AT THE [facility]:   Refer to the procedure report that was given to you for any specific questions about what was found during the examination.  If the procedure report does not answer your questions, please call your gastroenterologist to clarify.  If you requested that your care partner not be given the details of your procedure findings, then the procedure report has been included in a sealed envelope for you to review at your convenience later.  YOU SHOULD EXPECT: Some feelings of bloating in the abdomen. Passage of more gas than usual.  Walking can help get rid of the air that was put into your GI tract during the procedure and reduce the bloating. If you had a lower endoscopy (such as a colonoscopy or flexible sigmoidoscopy) you may notice spotting of blood in your stool or on the toilet paper. If you underwent a bowel prep for your procedure, you may not have a normal bowel movement for a few days.  Please Note:  You might notice some irritation and congestion in your nose or some drainage.  This is from the oxygen used during your procedure.  There is no need for concern and it should clear up in a day or so.  SYMPTOMS TO REPORT IMMEDIATELY:   Following lower endoscopy (colonoscopy or flexible sigmoidoscopy):  Excessive amounts of blood in the stool  Significant tenderness or worsening of abdominal pains  Swelling of the abdomen that is new, acute  Fever of 100F or higher   For urgent or emergent issues, a gastroenterologist can be reached at any hour by calling [phone].   DIET:  We do recommend a small meal at first, but then you may proceed to your regular diet.  Drink plenty of fluids but you should avoid alcoholic beverages for 24 hours.  ACTIVITY:  You should plan to take it easy for the rest of today and you should NOT  DRIVE or use heavy machinery until tomorrow (because of the sedation medicines used during the test).    FOLLOW UP: Our staff will call the number listed on your records the next business day following your procedure to check on you and address any questions or concerns that you may have regarding the information given to you following your procedure. If we do not reach you, we will leave a message.  However, if you are feeling well and you are not experiencing any problems, there is no need to return our call.  We will assume that you have returned to your regular daily activities without incident.  If any biopsies were taken you will be contacted by phone or by letter within the next 1-3 weeks.  Please call us at [phone] if you have not heard about the biopsies in 3 weeks.    SIGNATURES/CONFIDENTIALITY: You and/or your care partner have signed paperwork which will be entered into your electronic medical record.  These signatures attest to the fact that that the information above on your After Visit Summary has been reviewed and is understood.  Full responsibility of the confidentiality of this discharge information lies with you and/or your care-partner. 

## 2018-02-25 NOTE — Progress Notes (Signed)
Called to room to assist during endoscopic procedure.  Patient ID and intended procedure confirmed with present staff. Received instructions for my participation in the procedure from the performing physician.  

## 2018-02-25 NOTE — Progress Notes (Signed)
Pt's states no medical or surgical changes since previsit or office visit. 

## 2018-02-25 NOTE — Progress Notes (Signed)
To PACU, VSS. Report to [email or web address] 

## 2018-02-28 ENCOUNTER — Telehealth: Payer: Self-pay

## 2018-02-28 NOTE — Telephone Encounter (Signed)
  Follow up Call-  Call back number 02/25/2018  Post procedure Call Back phone  # [phone]  Permission to leave phone message Yes  Some recent data might be hidden     Patient questions:  Do you have a fever, pain , or abdominal swelling? No. Pain Score  0 *  Have you tolerated food without any problems? Yes.    Have you been able to return to your normal activities? Yes.    Do you have any questions about your discharge instructions: Diet   No. Medications  No. Follow up visit  No.  Do you have questions or concerns about your Care? No.  Actions: * If pain score is 4 or above: No action needed, pain <4.

## 2018-03-01 ENCOUNTER — Encounter: Payer: Self-pay | Admitting: Internal Medicine

## 2018-03-03 DIAGNOSIS — Z124 Encounter for screening for malignant neoplasm of cervix: Secondary | ICD-10-CM | POA: Diagnosis not present

## 2018-03-03 DIAGNOSIS — Z01419 Encounter for gynecological examination (general) (routine) without abnormal findings: Secondary | ICD-10-CM | POA: Diagnosis not present

## 2018-03-03 DIAGNOSIS — Z130 Encounter for screening for diseases of the blood and blood-forming organs and certain disorders involving the immune mechanism: Secondary | ICD-10-CM | POA: Diagnosis not present

## 2018-03-31 DIAGNOSIS — L237 Allergic contact dermatitis due to plants, except food: Secondary | ICD-10-CM | POA: Diagnosis not present

## 2018-04-08 ENCOUNTER — Other Ambulatory Visit: Payer: Self-pay | Admitting: Obstetrics and Gynecology

## 2018-04-08 DIAGNOSIS — Z139 Encounter for screening, unspecified: Secondary | ICD-10-CM

## 2018-04-08 DIAGNOSIS — E2839 Other primary ovarian failure: Secondary | ICD-10-CM

## 2018-05-19 ENCOUNTER — Ambulatory Visit
Admission: RE | Admit: 2018-05-19 | Discharge: 2018-05-19 | Disposition: A | Discharge status: Home / Self Care | Payer: 59 | Source: Ambulatory Visit | Attending: Obstetrics and Gynecology | Admitting: Obstetrics and Gynecology

## 2018-05-19 DIAGNOSIS — Z780 Asymptomatic menopausal state: Secondary | ICD-10-CM | POA: Diagnosis not present

## 2018-05-19 DIAGNOSIS — Z1231 Encounter for screening mammogram for malignant neoplasm of breast: Secondary | ICD-10-CM | POA: Diagnosis not present

## 2018-05-19 DIAGNOSIS — M85852 Other specified disorders of bone density and structure, left thigh: Secondary | ICD-10-CM | POA: Diagnosis not present

## 2018-05-19 DIAGNOSIS — E2839 Other primary ovarian failure: Secondary | ICD-10-CM

## 2018-05-19 DIAGNOSIS — Z139 Encounter for screening, unspecified: Secondary | ICD-10-CM

## 2018-08-05 DIAGNOSIS — L821 Other seborrheic keratosis: Secondary | ICD-10-CM | POA: Diagnosis not present

## 2018-08-05 DIAGNOSIS — L718 Other rosacea: Secondary | ICD-10-CM | POA: Diagnosis not present

## 2018-08-05 DIAGNOSIS — L728 Other follicular cysts of the skin and subcutaneous tissue: Secondary | ICD-10-CM | POA: Diagnosis not present

## 2018-08-30 DIAGNOSIS — Z23 Encounter for immunization: Secondary | ICD-10-CM | POA: Diagnosis not present

## 2018-10-12 DIAGNOSIS — L821 Other seborrheic keratosis: Secondary | ICD-10-CM | POA: Diagnosis not present

## 2018-10-12 DIAGNOSIS — D485 Neoplasm of uncertain behavior of skin: Secondary | ICD-10-CM | POA: Diagnosis not present

## 2018-12-08 DIAGNOSIS — Z0000 Encounter for general adult medical examination without abnormal findings: Secondary | ICD-10-CM | POA: Diagnosis not present

## 2018-12-08 DIAGNOSIS — Z13220 Encounter for screening for lipoid disorders: Secondary | ICD-10-CM | POA: Diagnosis not present

## 2018-12-08 DIAGNOSIS — Z131 Encounter for screening for diabetes mellitus: Secondary | ICD-10-CM | POA: Diagnosis not present

## 2019-03-08 DIAGNOSIS — M545 Low back pain: Secondary | ICD-10-CM | POA: Diagnosis not present

## 2019-04-20 ENCOUNTER — Other Ambulatory Visit: Payer: Self-pay | Admitting: Obstetrics and Gynecology

## 2019-04-20 DIAGNOSIS — Z1231 Encounter for screening mammogram for malignant neoplasm of breast: Secondary | ICD-10-CM

## 2019-05-23 DIAGNOSIS — S39012D Strain of muscle, fascia and tendon of lower back, subsequent encounter: Secondary | ICD-10-CM | POA: Diagnosis not present

## 2019-05-23 DIAGNOSIS — R399 Unspecified symptoms and signs involving the genitourinary system: Secondary | ICD-10-CM | POA: Diagnosis not present

## 2019-06-09 ENCOUNTER — Ambulatory Visit
Admission: RE | Admit: 2019-06-09 | Discharge: 2019-06-09 | Disposition: A | Discharge status: Home / Self Care | Payer: Medicare Other | Source: Ambulatory Visit | Attending: Obstetrics and Gynecology | Admitting: Obstetrics and Gynecology

## 2019-06-09 DIAGNOSIS — Z1231 Encounter for screening mammogram for malignant neoplasm of breast: Secondary | ICD-10-CM | POA: Diagnosis not present

## 2019-09-06 DIAGNOSIS — Z23 Encounter for immunization: Secondary | ICD-10-CM | POA: Diagnosis not present

## 2020-02-29 DIAGNOSIS — H9313 Tinnitus, bilateral: Secondary | ICD-10-CM | POA: Insufficient documentation

## 2020-02-29 DIAGNOSIS — H903 Sensorineural hearing loss, bilateral: Secondary | ICD-10-CM | POA: Insufficient documentation

## 2020-02-29 DIAGNOSIS — H906 Mixed conductive and sensorineural hearing loss, bilateral: Secondary | ICD-10-CM | POA: Diagnosis not present

## 2020-04-15 ENCOUNTER — Other Ambulatory Visit: Payer: Self-pay | Admitting: Family Medicine

## 2020-04-15 ENCOUNTER — Ambulatory Visit (INDEPENDENT_AMBULATORY_CARE_PROVIDER_SITE_OTHER): Payer: Medicare Other

## 2020-04-15 ENCOUNTER — Other Ambulatory Visit: Payer: Self-pay

## 2020-04-15 DIAGNOSIS — R1032 Left lower quadrant pain: Secondary | ICD-10-CM

## 2020-04-15 MED ORDER — IOHEXOL 300 MG/ML  SOLN
100.0000 mL | Freq: Once | INTRAMUSCULAR | Status: AC | PRN
Start: 2020-04-15 — End: 2020-04-15
  Administered 2020-04-15: 100 mL via INTRAVENOUS

## 2020-04-16 DIAGNOSIS — R000 Tachycardia, unspecified: Secondary | ICD-10-CM | POA: Diagnosis not present

## 2020-04-16 DIAGNOSIS — R1110 Vomiting, unspecified: Secondary | ICD-10-CM | POA: Diagnosis not present

## 2020-04-16 DIAGNOSIS — R519 Headache, unspecified: Secondary | ICD-10-CM | POA: Diagnosis not present

## 2020-04-16 DIAGNOSIS — E669 Obesity, unspecified: Secondary | ICD-10-CM | POA: Diagnosis not present

## 2020-04-16 DIAGNOSIS — R1032 Left lower quadrant pain: Secondary | ICD-10-CM | POA: Diagnosis not present

## 2020-04-16 DIAGNOSIS — R509 Fever, unspecified: Secondary | ICD-10-CM | POA: Diagnosis not present

## 2020-04-16 DIAGNOSIS — K5792 Diverticulitis of intestine, part unspecified, without perforation or abscess without bleeding: Secondary | ICD-10-CM | POA: Diagnosis not present

## 2020-05-27 ENCOUNTER — Other Ambulatory Visit: Payer: Self-pay | Admitting: Family Medicine

## 2020-05-27 ENCOUNTER — Other Ambulatory Visit: Payer: Self-pay | Admitting: Optometrist

## 2020-05-27 ENCOUNTER — Other Ambulatory Visit: Payer: Self-pay | Admitting: Obstetrics and Gynecology

## 2020-05-27 DIAGNOSIS — Z1231 Encounter for screening mammogram for malignant neoplasm of breast: Secondary | ICD-10-CM

## 2020-05-28 ENCOUNTER — Other Ambulatory Visit: Payer: Self-pay | Admitting: Nurse Practitioner

## 2020-05-28 DIAGNOSIS — E279 Disorder of adrenal gland, unspecified: Secondary | ICD-10-CM

## 2020-05-31 ENCOUNTER — Other Ambulatory Visit: Payer: Self-pay | Admitting: Nurse Practitioner

## 2020-05-31 DIAGNOSIS — E279 Disorder of adrenal gland, unspecified: Secondary | ICD-10-CM

## 2020-05-31 DIAGNOSIS — R1032 Left lower quadrant pain: Secondary | ICD-10-CM

## 2020-06-10 ENCOUNTER — Other Ambulatory Visit: Payer: Self-pay

## 2020-06-10 ENCOUNTER — Ambulatory Visit (INDEPENDENT_AMBULATORY_CARE_PROVIDER_SITE_OTHER): Payer: Medicare Other

## 2020-06-10 DIAGNOSIS — R1032 Left lower quadrant pain: Secondary | ICD-10-CM | POA: Diagnosis not present

## 2020-06-10 DIAGNOSIS — E279 Disorder of adrenal gland, unspecified: Secondary | ICD-10-CM | POA: Diagnosis not present

## 2020-06-10 MED ORDER — IOHEXOL 300 MG/ML  SOLN
100.0000 mL | Freq: Once | INTRAMUSCULAR | Status: AC | PRN
  Administered 2020-06-10: 100 mL via INTRAVENOUS

## 2020-06-13 ENCOUNTER — Other Ambulatory Visit: Payer: Self-pay

## 2020-06-13 ENCOUNTER — Ambulatory Visit
Admission: RE | Admit: 2020-06-13 | Discharge: 2020-06-13 | Disposition: A | Discharge status: Home / Self Care | Payer: Medicare Other | Source: Ambulatory Visit | Attending: Family Medicine | Admitting: Family Medicine

## 2020-06-13 DIAGNOSIS — Z1231 Encounter for screening mammogram for malignant neoplasm of breast: Secondary | ICD-10-CM

## 2020-06-20 DIAGNOSIS — D3502 Benign neoplasm of left adrenal gland: Secondary | ICD-10-CM | POA: Diagnosis not present

## 2020-06-20 DIAGNOSIS — E279 Disorder of adrenal gland, unspecified: Secondary | ICD-10-CM | POA: Diagnosis not present

## 2020-06-24 DIAGNOSIS — D3502 Benign neoplasm of left adrenal gland: Secondary | ICD-10-CM | POA: Diagnosis not present

## 2020-07-30 DIAGNOSIS — Z1152 Encounter for screening for COVID-19: Secondary | ICD-10-CM | POA: Diagnosis not present

## 2020-07-30 DIAGNOSIS — M791 Myalgia, unspecified site: Secondary | ICD-10-CM | POA: Diagnosis not present

## 2020-07-30 DIAGNOSIS — Z03818 Encounter for observation for suspected exposure to other biological agents ruled out: Secondary | ICD-10-CM | POA: Diagnosis not present

## 2020-08-09 DIAGNOSIS — U071 COVID-19: Secondary | ICD-10-CM | POA: Diagnosis not present

## 2020-08-22 DIAGNOSIS — L237 Allergic contact dermatitis due to plants, except food: Secondary | ICD-10-CM | POA: Diagnosis not present

## 2020-09-02 DIAGNOSIS — Z23 Encounter for immunization: Secondary | ICD-10-CM | POA: Diagnosis not present

## 2020-09-23 DIAGNOSIS — H25813 Combined forms of age-related cataract, bilateral: Secondary | ICD-10-CM | POA: Diagnosis not present

## 2020-09-23 DIAGNOSIS — H5203 Hypermetropia, bilateral: Secondary | ICD-10-CM | POA: Diagnosis not present

## 2020-11-28 DIAGNOSIS — Z23 Encounter for immunization: Secondary | ICD-10-CM | POA: Diagnosis not present

## 2021-01-21 DIAGNOSIS — H00022 Hordeolum internum right lower eyelid: Secondary | ICD-10-CM | POA: Diagnosis not present

## 2021-03-20 DIAGNOSIS — J019 Acute sinusitis, unspecified: Secondary | ICD-10-CM | POA: Diagnosis not present

## 2021-03-20 DIAGNOSIS — J069 Acute upper respiratory infection, unspecified: Secondary | ICD-10-CM | POA: Diagnosis not present

## 2021-05-02 ENCOUNTER — Other Ambulatory Visit: Payer: Self-pay | Admitting: Family Medicine

## 2021-05-02 DIAGNOSIS — Z1231 Encounter for screening mammogram for malignant neoplasm of breast: Secondary | ICD-10-CM

## 2021-06-23 DIAGNOSIS — D3502 Benign neoplasm of left adrenal gland: Secondary | ICD-10-CM | POA: Diagnosis not present

## 2021-06-24 DIAGNOSIS — N952 Postmenopausal atrophic vaginitis: Secondary | ICD-10-CM | POA: Insufficient documentation

## 2021-06-26 DIAGNOSIS — Z885 Allergy status to narcotic agent status: Secondary | ICD-10-CM | POA: Diagnosis not present

## 2021-06-26 DIAGNOSIS — E279 Disorder of adrenal gland, unspecified: Secondary | ICD-10-CM | POA: Diagnosis not present

## 2021-06-26 DIAGNOSIS — Z888 Allergy status to other drugs, medicaments and biological substances status: Secondary | ICD-10-CM | POA: Diagnosis not present

## 2021-06-26 DIAGNOSIS — D3502 Benign neoplasm of left adrenal gland: Secondary | ICD-10-CM | POA: Diagnosis not present

## 2021-06-27 ENCOUNTER — Other Ambulatory Visit: Payer: Self-pay

## 2021-06-27 ENCOUNTER — Ambulatory Visit
Admission: RE | Admit: 2021-06-27 | Discharge: 2021-06-27 | Disposition: A | Discharge status: Home / Self Care | Payer: Medicare Other | Source: Ambulatory Visit | Attending: Family Medicine | Admitting: Family Medicine

## 2021-06-27 DIAGNOSIS — Z1231 Encounter for screening mammogram for malignant neoplasm of breast: Secondary | ICD-10-CM

## 2021-09-25 DIAGNOSIS — Z23 Encounter for immunization: Secondary | ICD-10-CM | POA: Diagnosis not present

## 2022-03-11 DIAGNOSIS — J069 Acute upper respiratory infection, unspecified: Secondary | ICD-10-CM | POA: Diagnosis not present

## 2022-06-08 ENCOUNTER — Other Ambulatory Visit: Payer: Self-pay | Admitting: Family Medicine

## 2022-06-08 DIAGNOSIS — Z1231 Encounter for screening mammogram for malignant neoplasm of breast: Secondary | ICD-10-CM

## 2022-06-29 ENCOUNTER — Ambulatory Visit
Admission: RE | Admit: 2022-06-29 | Discharge: 2022-06-29 | Disposition: A | Discharge status: Home / Self Care | Payer: Medicare Other | Source: Ambulatory Visit | Attending: Family Medicine | Admitting: Family Medicine

## 2022-06-29 DIAGNOSIS — Z1231 Encounter for screening mammogram for malignant neoplasm of breast: Secondary | ICD-10-CM | POA: Diagnosis not present

## 2022-06-30 LAB — HM MAMMOGRAPHY

## 2022-09-17 DIAGNOSIS — Z23 Encounter for immunization: Secondary | ICD-10-CM | POA: Diagnosis not present

## 2022-10-29 DIAGNOSIS — H52223 Regular astigmatism, bilateral: Secondary | ICD-10-CM | POA: Diagnosis not present

## 2022-10-29 DIAGNOSIS — H5203 Hypermetropia, bilateral: Secondary | ICD-10-CM | POA: Diagnosis not present

## 2022-10-29 DIAGNOSIS — H524 Presbyopia: Secondary | ICD-10-CM | POA: Diagnosis not present

## 2022-10-29 DIAGNOSIS — H2513 Age-related nuclear cataract, bilateral: Secondary | ICD-10-CM | POA: Diagnosis not present

## 2022-11-05 DIAGNOSIS — R58 Hemorrhage, not elsewhere classified: Secondary | ICD-10-CM | POA: Diagnosis not present

## 2022-11-05 DIAGNOSIS — D235 Other benign neoplasm of skin of trunk: Secondary | ICD-10-CM | POA: Diagnosis not present

## 2022-11-05 DIAGNOSIS — L821 Other seborrheic keratosis: Secondary | ICD-10-CM | POA: Diagnosis not present

## 2022-11-05 DIAGNOSIS — D492 Neoplasm of unspecified behavior of bone, soft tissue, and skin: Secondary | ICD-10-CM | POA: Diagnosis not present

## 2023-02-01 ENCOUNTER — Ambulatory Visit (INDEPENDENT_AMBULATORY_CARE_PROVIDER_SITE_OTHER): Payer: HMO | Admitting: Family Medicine

## 2023-02-01 ENCOUNTER — Encounter: Payer: Self-pay | Admitting: Family Medicine

## 2023-02-01 VITALS — BP 119/69 | HR 71 | Temp 97.8°F | Ht 62.4 in | Wt 171.8 lb

## 2023-02-01 DIAGNOSIS — E278 Other specified disorders of adrenal gland: Secondary | ICD-10-CM

## 2023-02-01 DIAGNOSIS — R1013 Epigastric pain: Secondary | ICD-10-CM

## 2023-02-01 DIAGNOSIS — R112 Nausea with vomiting, unspecified: Secondary | ICD-10-CM

## 2023-02-01 DIAGNOSIS — Z7689 Persons encountering health services in other specified circumstances: Secondary | ICD-10-CM

## 2023-02-01 DIAGNOSIS — R1011 Right upper quadrant pain: Secondary | ICD-10-CM | POA: Diagnosis not present

## 2023-02-01 LAB — CBC WITH DIFFERENTIAL/PLATELET
Basophils Absolute: 0.1 10*3/uL (ref 0.0–0.1)
Basophils Relative: 1 % (ref 0.0–3.0)
Eosinophils Absolute: 0.1 10*3/uL (ref 0.0–0.7)
Eosinophils Relative: 1.5 % (ref 0.0–5.0)
HCT: 42.7 % (ref 36.0–46.0)
Hemoglobin: 14.2 g/dL (ref 12.0–15.0)
Lymphocytes Relative: 31.7 % (ref 12.0–46.0)
Lymphs Abs: 2.3 10*3/uL (ref 0.7–4.0)
MCHC: 33.3 g/dL (ref 30.0–36.0)
MCV: 90.6 fl (ref 78.0–100.0)
Monocytes Absolute: 0.5 10*3/uL (ref 0.1–1.0)
Monocytes Relative: 6.1 % (ref 3.0–12.0)
Neutro Abs: 4.4 10*3/uL (ref 1.4–7.7)
Neutrophils Relative %: 59.7 % (ref 43.0–77.0)
Platelets: 339 10*3/uL (ref 150.0–400.0)
RBC: 4.72 Mil/uL (ref 3.87–5.11)
RDW: 14.4 % (ref 11.5–15.5)
WBC: 7.4 10*3/uL (ref 4.0–10.5)

## 2023-02-01 LAB — C-REACTIVE PROTEIN: CRP: <1 mg/dL (ref 0.5–20.0)

## 2023-02-01 LAB — LIPASE: Lipase: 33 U/L (ref 11.0–59.0)

## 2023-02-01 LAB — COMPREHENSIVE METABOLIC PANEL
ALT: 20 U/L (ref 0–35)
AST: 19 U/L (ref 0–37)
Albumin: 4.3 g/dL (ref 3.5–5.2)
Alkaline Phosphatase: 64 U/L (ref 39–117)
BUN: 13 mg/dL (ref 6–23)
CO2: 28 mEq/L (ref 19–32)
Calcium: 9.9 mg/dL (ref 8.4–10.5)
Chloride: 101 mEq/L (ref 96–112)
Creatinine, Ser: 0.73 mg/dL (ref 0.40–1.20)
GFR: 84.24 mL/min (ref >60.00)
Glucose, Bld: 81 mg/dL (ref 70–99)
Potassium: 4.4 mEq/L (ref 3.5–5.1)
Sodium: 139 mEq/L (ref 135–145)
Total Bilirubin: 0.6 mg/dL (ref 0.2–1.2)
Total Protein: 6.6 g/dL (ref 6.0–8.3)

## 2023-02-01 NOTE — Progress Notes (Signed)
Patient ID: Crystal [name], female  DOB: 07/27/1956, 67 y.o.   MRN: [id number] Patient Care Team    Relationship Specialty Notifications Start End  Ma Hillock, DO PCP - General Family Medicine  02/01/23   Pa, [facility]  Ophthalmology  01/28/23   Sula Rumple, MD Referring Physician General Surgery  01/28/23   Macario Carls, MD Referring Physician Specialist  01/28/23    Comment: dermatology  [place], [place] Physician Gastroenterology  02/01/23     Chief Complaint  Patient presents with   Emesis    Starting in April 2020. Reoccurs q 2-4 months (01/18/2023); no specific reason. Has been told could be gallbladder. Has been sore since having these episodes    Subjective:  Crystal [name] is a 67 y.o.  female present for new patient establishment. All past medical history, surgical history, allergies, family history, immunizations, medications and social history were updated in the electronic medical record today. All recent labs, ED visits and hospitalizations within the last year were reviewed.  Presents today for new patient establishment to discuss her nausea, vomiting with epigastric and right upper quadrant discomfort.  Patient reports symptoms started back in April 2020.  It will intermittently occur by starting with a viselike pressure around her lower chest/bra line, she then becomes very hot and nauseated.  She will then have the urge to vomit in which she states vomiting will last for about 40 minutes intermittently and to she clears out any stomach contents.  The symptoms come on suddenly.  She will feel sore in her epigastric and right upper quadrant region after it occurs.  She feels it was once every 3 to 4 months for some time, but her last episode was February 19, and the episode before that was December 4.  She is still mildly tender from the episode 2 weeks ago. She has the urge to get up and move around when the symptoms start.  She denies  any chest pain or radiation of pain to jaw or arm.  She feels restricted or pressure across to her chest and bra line, but no shortness of breath. She reports the last episode occurred about an hour eating,she went out and sat on the porch with her dog.  As soon as she sat down she felt the tightness began.  The time prior was in a car sitting down and had eaten breakfast about 1 to 2 hours prior. No fevers, no chills, no bilious, fecal or projectile vomiting.  She states the majority of the time it is digested food. Pain/symptoms are not brought on by activity. Family history of pancreatic cancer in her father and heart disease in her mother.     02/01/2023   10:09 AM  Depression screen PHQ 2/9  Decreased Interest 0  Down, Depressed, Hopeless 0  PHQ - 2 Score 0       No data to display                 02/01/2023   10:09 AM  Fall Risk   Falls in the past year? 0  Number falls in past yr: 0  Injury with Fall? 0  Follow up Falls evaluation completed     Immunization History  Administered Date(s) Administered   DTaP 12/15/2012   Fluad Quad(high Dose 65+) 08/30/2022   Influenza Split 08/29/2012   Pneumococcal Conjugate,unspecified 10/30/2020   Pneumococcal Polysaccharide-23 08/25/2012   Tdap 10/30/2014    No results  found.  Past Medical History:  Diagnosis Date   Acute respiratory failure with hypoxia ([place]) 12/07/2012   Atypical ductal hyperplasia of breast 01/08/2016   Chicken pox    Colon polyps    Diverticulitis    may 2021   GERD (gastroesophageal reflux disease)    Pleural effusion 12/07/2012   Pneumonia    11/2010;06/2012;11/2012   Allergies  Allergen Reactions   Hydromorphone Other (See Comments)    Dilaudid - semi coma when mixed with phenergan  Other Reaction(s): Diaphoresis / Sweating (intolerance), Unknown   Meperidine Other (See Comments)    When mixed with Phenergan, leads to severe somnolence and "blue lips."  When mixed with Phenergan, leads  to severe somnolence and "blue lips."  When mixed with Phenergan, leads to severe somnolence and "blue lips."   Phenergan [Promethazine Hcl] Other (See Comments)    When mixed with Demerol, leads to severe somnolence and "blue lips."   Promethazine Anaphylaxis and Other (See Comments)    Other Reaction(s): semi coma  When mixed with Demerol, leads to severe somnolence and "blue lips."   Pollen Extract     Other Reaction(s): Unknown   Past Surgical History:  Procedure Laterality Date   ABDOMINAL HYSTERECTOMY     2009   APPENDECTOMY     2016   BREAST BIOPSY Left 09/10/2006   BREAST BIOPSY N/A    2016;2017   COLONOSCOPY     2019   dental implant     RADIOACTIVE SEED GUIDED EXCISIONAL BREAST BIOPSY Left 12/10/2015   Procedure: LEFT BREAST RADIOACTIVE SEED GUIDED EXCISIONAL BIOPSY;  Surgeon: Rolm Bookbinder, MD;  Location: [place];  Service: General;  Laterality: Left;   Family History  Problem Relation Age of Onset   Heart disease Mother    Heart failure Mother    Pancreatic cancer Father    Alcohol abuse Father    Breast cancer Sister    Hyperlipidemia Sister    Hypertension Brother    Alcohol abuse Brother    Colon cancer Neg Hx    Colon polyps Neg Hx    Rectal cancer Neg Hx    Stomach cancer Neg Hx    Esophageal cancer Neg Hx    Social History   Social History Narrative   Marital status/children/pets: Married   Education/employment: 12th grade graduate, retired   Engineer, materials:      -smoke alarm in the home:Yes     - wears seatbelt: Yes     - Feels safe in their relationships: Yes       Allergies as of 02/01/2023       Reactions   Hydromorphone Other (See Comments)   Dilaudid - semi coma when mixed with phenergan Other Reaction(s): Diaphoresis / Sweating (intolerance), Unknown   Meperidine Other (See Comments)   When mixed with Phenergan, leads to severe somnolence and "blue lips." When mixed with Phenergan, leads to severe somnolence and "blue  lips."  When mixed with Phenergan, leads to severe somnolence and "blue lips."   Phenergan [promethazine Hcl] Other (See Comments)   When mixed with Demerol, leads to severe somnolence and "blue lips."   Promethazine Anaphylaxis, Other (See Comments)   Other Reaction(s): semi coma When mixed with Demerol, leads to severe somnolence and "blue lips."   Pollen Extract    Other Reaction(s): Unknown        Medication List        Accurate as of February 01, 2023 11:51 AM. If you have any  questions, ask your nurse or doctor.          STOP taking these medications    Fish Oil 1000 MG Caps Stopped by: Howard Pouch, DO       TAKE these medications    CALCIUM/VITAMIN D PO Take by mouth. 600 mg-800u 3x/week   MegaRed Omega-3 Krill Oil 500 MG Caps 1 tablet Orally once a day   Multivitamin Adult Tabs 1 tablet Orally once a day   PREMARIN VA Place vaginally once a week.   Probiotic Tbec 1 tablet Orally once a day   Quercetin 500 MG Caps Take by mouth.   zinc gluconate 50 MG tablet Take 50 mg by mouth daily.        All past medical history, surgical history, allergies, family history, immunizations andmedications were updated in the EMR today and reviewed under the history and medication portions of their EMR.    No results found for this or any previous visit (from the past 2160 hour(s)).  MM 3D SCREEN BREAST BILATERAL  Result Date: 06/30/2022 CLINICAL DATA:  Screening. EXAM: DIGITAL SCREENING BILATERAL MAMMOGRAM WITH TOMOSYNTHESIS AND CAD TECHNIQUE: Bilateral screening digital craniocaudal and mediolateral oblique mammograms were obtained. Bilateral screening digital breast tomosynthesis was performed. The images were evaluated with computer-aided detection. COMPARISON:  Previous exam(s). ACR Breast Density Category c: The breast tissue is heterogeneously dense, which may obscure small masses. FINDINGS: There are no findings suspicious for malignancy. IMPRESSION: No  mammographic evidence of malignancy. A result letter of this screening mammogram will be mailed directly to the patient. RECOMMENDATION: Screening mammogram in one year. (Code:SM-B-01Y) BI-RADS CATEGORY  1: Negative. Electronically Signed   By: Dorise Bullion III M.D.   On: 06/30/2022 19:09     ROS 14 pt review of systems performed and negative (unless mentioned in an HPI)  Objective: BP 119/69   Pulse 71   Temp 97.8 F (36.6 C)   Ht 5' 2.4" (1.585 m)   Wt 171 lb 12.8 oz (77.9 kg)   SpO2 98%   BMI 31.02 kg/m  Physical Exam Vitals and nursing note reviewed.  Constitutional:      General: She is not in acute distress.    Appearance: Normal appearance. She is not ill-appearing, toxic-appearing or diaphoretic.  HENT:     Head: Normocephalic and atraumatic.  Eyes:     General: No scleral icterus.       Right eye: No discharge.        Left eye: No discharge.     Extraocular Movements: Extraocular movements intact.     Conjunctiva/sclera: Conjunctivae normal.     Pupils: Pupils are equal, round, and reactive to light.  Cardiovascular:     Rate and Rhythm: Normal rate and regular rhythm.  Pulmonary:     Effort: Pulmonary effort is normal. No respiratory distress.     Breath sounds: Normal breath sounds. No wheezing, rhonchi or rales.  Abdominal:     General: Abdomen is flat. Bowel sounds are normal. There is no distension.     Palpations: Abdomen is soft. There is no mass.     Tenderness: There is abdominal tenderness in the right upper quadrant and epigastric area. There is no guarding or rebound. Negative signs include Murphy's sign and McBurney's sign.     Hernia: No hernia is present.    Musculoskeletal:     Right lower leg: No edema.     Left lower leg: No edema.  Skin:    General: Skin  is warm.     Findings: No rash.  Neurological:     Mental Status: She is alert and oriented to person, place, and time. Mental status is at baseline.     Motor: No weakness.     Gait:  Gait normal.  Psychiatric:        Mood and Affect: Mood normal.        Behavior: Behavior normal.        Thought Content: Thought content normal.        Judgment: Judgment normal.       Assessment/plan: Crystal [name] is a 67 y.o. female present for Establishing care with new doctor, encounter for Adrenal nodule (HCC)-stable since 2016 Monitored by Dr. Laverle Patter CT for adrenal nodule scheduled for July per patient. Has been stable since 2016  Epigastric pain/nausea and vomiting/right upper quadrant abdominal pain Uncertain etiology of patient's discomfort.  Now occurring more frequently, present greater than 2 years.  Family history of pancreatic cancer. We did test differential diagnosis today of hiatal hernia, gallbladder disease versus gallstones, pancreatitis, duodenitis or liver etiology. She is tender on exam today in the right epigastric region and right upper quadrant. Can not rule out cardiac in nature, however the symptoms do not seem to be typical of cardiac pain, they are occurring when she is not active and soreness is in her epigastric and right upper quadrant after attacks.  If labs and CT do not reveal cause would consider cardiac evaluation.  History of heart disease in her mother. - Comp Met (CMET) - CBC w/Diff - Lipase - C-reactive protein - CT ABDOMEN W CONTRAST; Future She is established with GI. Once labs and imaging results return, we will discuss next steps for treatment.   No follow-ups on file.  Orders Placed This Encounter  Procedures   CT ABDOMEN W CONTRAST   Comp Met (CMET)   CBC w/Diff   Lipase   C-reactive protein   No orders of the defined types were placed in this encounter.  Referral Orders  No referral(s) requested today     Note is dictated utilizing voice recognition software. Although note has been proof read prior to signing, occasional typographical errors still can be missed. If any questions arise, please do not hesitate  to call for verification.  Electronically signed by: Howard Pouch, DO [place]

## 2023-02-02 ENCOUNTER — Telehealth: Payer: Self-pay | Admitting: Family Medicine

## 2023-02-02 ENCOUNTER — Ambulatory Visit: Payer: HMO

## 2023-02-02 NOTE — Telephone Encounter (Signed)
LM for pt to return call to discuss.  

## 2023-02-02 NOTE — Telephone Encounter (Signed)
Pt understood lab results and had no additional questions or concerns.

## 2023-02-02 NOTE — Telephone Encounter (Signed)
Please advise patient Her blood cell counts are normal no signs of anemia or infection. Her liver and kidney function are normal Her inflammatory marker is also normal. These are reassuring signs.   We will wait on her CT result and then discuss further plan

## 2023-02-03 ENCOUNTER — Ambulatory Visit (HOSPITAL_BASED_OUTPATIENT_CLINIC_OR_DEPARTMENT_OTHER)
Admission: RE | Admit: 2023-02-03 | Discharge: 2023-02-03 | Disposition: A | Discharge status: Home / Self Care | Payer: HMO | Source: Ambulatory Visit | Attending: Family Medicine | Admitting: Family Medicine

## 2023-02-03 DIAGNOSIS — R112 Nausea with vomiting, unspecified: Secondary | ICD-10-CM | POA: Insufficient documentation

## 2023-02-03 DIAGNOSIS — R1013 Epigastric pain: Secondary | ICD-10-CM | POA: Insufficient documentation

## 2023-02-03 DIAGNOSIS — R1011 Right upper quadrant pain: Secondary | ICD-10-CM | POA: Diagnosis not present

## 2023-02-03 MED ORDER — IOHEXOL 300 MG/ML  SOLN
100.0000 mL | Freq: Once | INTRAMUSCULAR | Status: AC | PRN
  Administered 2023-02-03: 100 mL via INTRAVENOUS

## 2023-02-05 ENCOUNTER — Telehealth: Payer: Self-pay | Admitting: Family Medicine

## 2023-02-05 DIAGNOSIS — R112 Nausea with vomiting, unspecified: Secondary | ICD-10-CM

## 2023-02-05 DIAGNOSIS — R1011 Right upper quadrant pain: Secondary | ICD-10-CM

## 2023-02-05 DIAGNOSIS — R1013 Epigastric pain: Secondary | ICD-10-CM

## 2023-02-05 NOTE — Telephone Encounter (Signed)
Please inform patient Crystal [name] CT of Crystal [name] abdomen was normal and no cause identified for Crystal [name] recurrent abdominal pain. Although this is reassuring, it does not give [place] the answers we were looking for. I have placed a referral for Crystal [name] to gastroenterology for further evaluation they likely may need to order a specialized imaging and/or EGD performed.

## 2023-02-05 NOTE — Telephone Encounter (Signed)
Spoke with patient regarding results/recommendations.  

## 2023-02-08 ENCOUNTER — Encounter: Payer: Self-pay | Admitting: Physician Assistant

## 2023-02-08 ENCOUNTER — Ambulatory Visit (INDEPENDENT_AMBULATORY_CARE_PROVIDER_SITE_OTHER): Payer: HMO | Admitting: Physician Assistant

## 2023-02-08 ENCOUNTER — Other Ambulatory Visit: Payer: HMO

## 2023-02-08 ENCOUNTER — Other Ambulatory Visit: Payer: Self-pay

## 2023-02-08 VITALS — BP 126/78 | HR 86 | Ht 62.0 in | Wt 173.0 lb

## 2023-02-08 DIAGNOSIS — Z86010 Personal history of colonic polyps: Secondary | ICD-10-CM

## 2023-02-08 DIAGNOSIS — R112 Nausea with vomiting, unspecified: Secondary | ICD-10-CM | POA: Diagnosis not present

## 2023-02-08 DIAGNOSIS — R109 Unspecified abdominal pain: Secondary | ICD-10-CM

## 2023-02-08 NOTE — Progress Notes (Signed)
Subjective:    Patient ID: Crystal [name], female    DOB: May 07, 1956, 67 y.o.   MRN: [id number]  HPI  Crystal [name] is a pleasant 67 year old white female, established with Dr. Henrene Pastor who comes back in today with complaints of recurrent episodes of nausea vomiting and upper abdominal pain.  She had recently been seen by her PCP Howard Pouch DO for the symptoms and underwent CT scan of the abdomen and pelvis with contrast on 02/03/2023.  This showed a normal gallbladder no ductal dilation, unchanged 1.1 cm left adrenal nodule and was otherwise unremarkable.  She had labs done last week with normal CBC c-Met lipase and CRP. Here she had undergone colonoscopy in March 2019 with removal of 4 polyps all 2 to 3 mm in size and was noted to have multiple diverticuli in the left colon.  Polyp path showed 3 tubular adenomas and she is indicated for 5-year interval follow-up. She does have prior history of diverticulitis, is status post appendectomy. She says she has had intermittent episodes of vomiting and abdominal pain over the past 3 to 4 years.  She has started tracking these and says that now she is having episodes more frequently.  She had 1 in February and 1 prior to that was on December 4.  She describes abrupt onset of tight bandlike pain around her lower chest upper abdomen that radiates around bilaterally into her back followed by nausea and vomiting with multiple episodes.  She says these episodes exhaust her.  The vomiting may occur repeatedly for about 45 minutes and then the pain may last for a couple of hours and then resolved.  She usually feels bloated and uncomfortable in her upper abdomen as well.  Never has fever chills or sweats.  In between these episodes she feels fine.  She has been unable to determine any specific food triggers.  She has not had regular abdominal ultrasound. No prior EGD  Review of Systems Pertinent positive and negative review of systems were noted in the above HPI section.   All other review of systems was otherwise negative.   Outpatient Encounter Medications as of 02/08/2023  Medication Sig   Probiotic TBEC 1 tablet Orally once a day   Calcium Carb-Cholecalciferol (CALCIUM/VITAMIN D PO) Take by mouth. 600 mg-800u 3x/week (Patient not taking: Reported on 02/08/2023)   Estrogens, Conjugated (PREMARIN VA) Place vaginally once a week. (Patient not taking: Reported on 02/08/2023)   MegaRed Omega-3 Krill Oil 500 MG CAPS 1 tablet Orally once a day (Patient not taking: Reported on 02/08/2023)   Multiple Vitamin (MULTIVITAMIN ADULT) TABS 1 tablet Orally once a day (Patient not taking: Reported on 02/08/2023)   Quercetin 500 MG CAPS Take by mouth. (Patient not taking: Reported on 02/08/2023)   zinc gluconate 50 MG tablet Take 50 mg by mouth daily. (Patient not taking: Reported on 02/08/2023)   No facility-administered encounter medications on file as of 02/08/2023.   Allergies  Allergen Reactions   Hydromorphone Other (See Comments)    Dilaudid - semi coma when mixed with phenergan  Other Reaction(s): Diaphoresis / Sweating (intolerance), Unknown   Meperidine Other (See Comments)    When mixed with Phenergan, leads to severe somnolence and "blue lips."  When mixed with Phenergan, leads to severe somnolence and "blue lips."  When mixed with Phenergan, leads to severe somnolence and "blue lips."   Phenergan [Promethazine Hcl] Other (See Comments)    When mixed with Demerol, leads to severe somnolence and "blue lips."  Promethazine Anaphylaxis and Other (See Comments)    Other Reaction(s): semi coma  When mixed with Demerol, leads to severe somnolence and "blue lips."   Pollen Extract     Other Reaction(s): Unknown   Patient Active Problem List   Diagnosis Date Noted   Adrenal nodule ([place]) 02/01/2023   Bilateral high frequency sensorineural hearing loss 02/29/2020   Vomiting 12/07/2012   Social History   Socioeconomic History   Marital status: Married     Spouse name: Not on file   Number of children: 0   Years of education: Not on file   Highest education level: Not on file  Occupational History   Occupation: retired  Tobacco Use   Smoking status: Never    Passive exposure: Never   Smokeless tobacco: Never  Vaping Use   Vaping Use: Never used  Substance and Sexual Activity   Alcohol use: Yes    Comment: 1 -2 bers a day   Drug use: Never   Sexual activity: Yes    Partners: Male    Comment: married  Other Topics Concern   Not on file  Social History Narrative   Marital status/children/pets: Married   Education/employment: 12th grade graduate, retired   Engineer, materials:      -smoke alarm in the home:Yes     - wears seatbelt: Yes     - Feels safe in their relationships: Yes      Social Determinants of Radio broadcast assistant Strain: Not on file  Food Insecurity: Not on file  Transportation Needs: Not on file  Physical Activity: Not on file  Stress: Not on file  Social Connections: Not on file  Intimate Partner Violence: Not on file    Ms. Hoot's family history includes Alcohol abuse in her brother and father; Breast cancer in her sister; Heart disease in her mother; Heart failure in her mother; Hyperlipidemia in her sister; Hypertension in her brother; Pancreatic cancer in her father.      Objective:    Vitals:   02/08/23 1024  BP: 126/78  Pulse: 86    Physical Exam Well-developed well-nourished older WF  in no acute distress.  Height, Weight,173 BMI 31.64  HEENT; nontraumatic normocephalic, EOMI, PE R LA, sclera anicteric. Oropharynx;not done Neck; supple, no JVD Cardiovascular; regular rate and rhythm with S1-S2, no murmur rub or gallop Pulmonary; Clear bilaterally Abdomen; soft, nontender, nondistended, no palpable mass or hepatosplenomegaly, bowel sounds are active Rectal;not done  Skin; benign exam, no jaundice rash or appreciable lesions Extremities; no clubbing cyanosis or edema skin warm and  dry Neuro/Psych; alert and oriented x4, grossly nonfocal mood and affect appropriate        Assessment & Plan:   #11 67 year old white female with recurrent episodes of lower chest/epigastric abdominal pain described as a tight band around the abdomen radiating bilaterally into the abdomen, severe at onset associated with nausea and vomiting.  Vomiting usually lasting 45 minutes and pain lasting up to a couple of hours and then resolves.  Patient feels fine in between these episodes.  Very recent CT on 02/02/2022 unremarkable as to any etiology for these episodes. Despite normal reading of gallbladder on CT I am concerned about underlying gallbladder disease.  #2 stable 1.1 cm left adrenal nodule #3.  History of diverticulosis and diverticulitis #4.  Status post appendectomy #5.  History of adenomatous colon polyps-due for follow-up colonoscopy March 2024.  Plan; Will schedule for upper abdominal ultrasound, if ultrasound is unrevealing then [facility]CK HIDA  scan.  If CCK HIDA scan negative then will need EGD with Dr. Henrene Pastor .  Will hold on scheduling colonoscopy until we complete the gallbladder workup so that if she needs EGD this can be scheduled to be done at the same setting as colonoscopy. Discussed low-fat diet as less likely to trigger any biliary colic type symptoms Further recommendations pending results of above   Alfredia Ferguson PA-C 02/08/2023   Cc: Howard Pouch A, DO

## 2023-02-08 NOTE — Patient Instructions (Addendum)
_______________________________________________________  If your blood pressure at your visit was 140/90 or greater, please contact your primary care physician to follow up on this.  _______________________________________________________  If you are age 69 or older, your body mass index should be between 23-30. Your Body mass index is 31.64 kg/m. If this is out of the aforementioned range listed, please consider follow up with your Primary Care Provider.  If you are age 54 or younger, your body mass index should be between 19-25. Your Body mass index is 31.64 kg/m. If this is out of the aformentioned range listed, please consider follow up with your Primary Care Provider.   ________________________________________________________  The [facility] providers would like to encourage you to use [facility] to communicate with providers for non-urgent requests or questions.  Due to long hold times on the telephone, sending your provider a message by [facility] may be a faster and more efficient way to get a response.  Please allow 48 business hours for a response.  Please remember that this is for non-urgent requests.  _______________________________________________________  _______________________________________________________  If your blood pressure at your visit was 140/90 or greater, please contact your primary care physician to follow up on this.  _______________________________________________________  If you are age 71 or older, your body mass index should be between 23-30. Your Body mass index is 31.64 kg/m. If this is out of the aforementioned range listed, please consider follow up with your Primary Care Provider.  If you are age 68 or younger, your body mass index should be between 19-25. Your Body mass index is 31.64 kg/m. If this is out of the aformentioned range listed, please consider follow up with your Primary Care Provider.    ________________________________________________________  Dennis Bast have been scheduled for an abdominal ultrasound at [facility] (1st floor of hospital) on 02/10/23 at 8:30 am. Please arrive 15 minutes prior to your appointment for registration. Make certain not to have anything to eat or drink after midnight the night prior to your appointment. Should you need to reschedule your appointment, please contact radiology at [phone]. This test typically takes about 30 minutes to perform.  Thank you for trusting me with your gastrointestinal care!   Amy Esterwood PA-C

## 2023-02-09 NOTE — Progress Notes (Signed)
Noted  

## 2023-02-10 ENCOUNTER — Ambulatory Visit (HOSPITAL_COMMUNITY): Payer: PRIVATE HEALTH INSURANCE | Attending: Physician Assistant

## 2023-02-10 ENCOUNTER — Telehealth: Payer: Self-pay

## 2023-02-10 DIAGNOSIS — Z860101 Personal history of adenomatous and serrated colon polyps: Secondary | ICD-10-CM

## 2023-02-10 DIAGNOSIS — R112 Nausea with vomiting, unspecified: Secondary | ICD-10-CM | POA: Diagnosis not present

## 2023-02-10 DIAGNOSIS — Z86010 Personal history of colonic polyps: Secondary | ICD-10-CM | POA: Diagnosis not present

## 2023-02-10 DIAGNOSIS — R109 Unspecified abdominal pain: Secondary | ICD-10-CM

## 2023-02-10 NOTE — Telephone Encounter (Signed)
Call report on patient abdominal sonogram of today.   Under 'IMPRESSION:"  2. There is a 17 mm hypoechoic masslike area in the RIGHT liver which is indeterminate. This may reflect underlying focal fatty sparing but mass is not excluded. Recommend further evaluation with dedicated liver protocol MRI or CT with and without contrast.   Routed to Dr Henrene [facility]or [facility] absence.

## 2023-02-10 NOTE — Telephone Encounter (Signed)
I did not see this patient, but reviewed Amy's note. Patient has gallstones on ultrasound with contracted gallbladder.  Suspect her problems with nausea vomiting and pain are related to her gallbladder. Small incidental indeterminate area of the liver for which radiology recommends liver MRI to further assess. Recommendations: 1.  Schedule MRI of the liver to evaluate ultrasound abnormalities 2.  Referred to general surgery "symptomatic gallstones, evaluate for laparoscopic cholecystectomy" 3.  I am copying Amy on this to see if she concurs and/or has other thoughts. Dr. Henrene Pastor

## 2023-02-11 ENCOUNTER — Other Ambulatory Visit: Payer: Self-pay

## 2023-02-11 DIAGNOSIS — R160 Hepatomegaly, not elsewhere classified: Secondary | ICD-10-CM

## 2023-02-11 NOTE — Telephone Encounter (Signed)
Discuss the results and recommendations as explained by Dr Henrene Pastor. Patient agrees to the plan as explained. Referral to [facility] and records faxed. Order for the MRI in [facility]. Radiology Scheduling notified to contact the patient.

## 2023-02-19 DIAGNOSIS — K769 Liver disease, unspecified: Secondary | ICD-10-CM | POA: Diagnosis not present

## 2023-02-19 DIAGNOSIS — K8020 Calculus of gallbladder without cholecystitis without obstruction: Secondary | ICD-10-CM | POA: Diagnosis not present

## 2023-02-19 NOTE — Patient Instructions (Signed)
SURGICAL WAITING ROOM VISITATION  Patients having surgery or a procedure may have no more than 2 support people in the waiting area - these visitors may rotate.    Children under the age of 56 must have an adult with them who is not the patient.  Due to an increase in RSV and influenza rates and associated hospitalizations, children ages 42 and under may not visit patients in [place].  If the patient needs to stay at the hospital during part of their recovery, the visitor guidelines for inpatient rooms apply. Pre-op nurse will coordinate an appropriate time for 1 support person to accompany patient in pre-op.  This support person may not rotate.    Please refer to the [facility] website for the visitor guidelines for Inpatients (after your surgery is over and you are in a regular room).    Your procedure is scheduled on: 03/05/23   Report to [facility] Main Entrance    Report to admitting at 12:35 AM   Call this number if you have problems the morning of surgery [phone]   Do not eat food :After Midnight.   After Midnight you may have the following liquids until 11:50 AM DAY OF SURGERY  Water Non-Citrus Juices (without pulp, NO RED-Apple, White grape, White cranberry) Black Coffee (NO MILK/CREAM OR CREAMERS, sugar ok)  Clear Tea (NO MILK/CREAM OR CREAMERS, sugar ok) regular and decaf                             Plain Jell-O (NO RED)                                           Fruit ices (not with fruit pulp, NO RED)                                     Popsicles (NO RED)                                                               Sports drinks like Gatorade (NO RED)                  If you have questions, please contact your surgeon's office.   FOLLOW BOWEL PREP AND ANY ADDITIONAL PRE OP INSTRUCTIONS YOU RECEIVED FROM YOUR SURGEON'S OFFICE!!!     Oral Hygiene is also important to reduce your risk of infection.                                     Remember - BRUSH YOUR TEETH THE MORNING OF SURGERY WITH YOUR REGULAR TOOTHPASTE  DENTURES WILL BE REMOVED PRIOR TO SURGERY PLEASE DO NOT APPLY "Poly grip" OR ADHESIVES!!!   Take these medicines the morning of surgery with A SIP OF WATER: Zyrtec                              You  may not have any metal on your body including hair pins, jewelry, and body piercing             Do not wear make-up, lotions, powders, perfumes/cologne, or deodorant  Do not wear nail polish including gel and S&S, artificial/acrylic nails, or any other type of covering on natural nails including finger and toenails. If you have artificial nails, gel coating, etc. that needs to be removed by a nail salon please have this removed prior to surgery or surgery may need to be canceled/ delayed if the surgeon/ anesthesia feels like they are unable to be safely monitored.   Do not shave  48 hours prior to surgery.    Do not bring valuables to the hospital. [place].   Contacts, glasses, dentures or bridgework may not be worn into surgery.  DO NOT [place]. PHARMACY WILL DISPENSE MEDICATIONS LISTED ON YOUR MEDICATION LIST TO YOU DURING YOUR ADMISSION [place]!    Patients discharged on the day of surgery will not be allowed to drive home.  Someone NEEDS to stay with you for the first 24 hours after anesthesia.              Please read over the following fact sheets you were given: IF [place] [phone] Schneiders    If you received a COVID test during your pre-op visit  it is requested that you wear a mask when out in public, stay away from anyone that may not be feeling well and notify your surgeon if you develop symptoms. If you test positive for Covid or have been in contact with anyone that has tested positive in the last 10 days please notify you surgeon.    [place] -  Preparing for Surgery Before surgery, you can play an important role.  Because skin is not sterile, your skin needs to be as free of germs as possible.  You can reduce the number of germs on your skin by washing with CHG (chlorahexidine gluconate) soap before surgery.  CHG is an antiseptic cleaner which kills germs and bonds with the skin to continue killing germs even after washing. Please DO NOT use if you have an allergy to CHG or antibacterial soaps.  If your skin becomes reddened/irritated stop using the CHG and inform your nurse when you arrive at Short Stay. Do not shave (including legs and underarms) for at least 48 hours prior to the first CHG shower.  You may shave your face/neck.  Please follow these instructions carefully:  1.  Shower with CHG Soap the night before surgery and the  morning of surgery.  2.  If you choose to wash your hair, wash your hair first as usual with your normal  shampoo.  3.  After you shampoo, rinse your hair and body thoroughly to remove the shampoo.                             4.  Use CHG as you would any other liquid soap.  You can apply chg directly to the skin and wash.  Gently with a scrungie or clean washcloth.  5.  Apply the CHG Soap to your body ONLY FROM THE NECK DOWN.   Do   not use on face/  open                           Wound or open sores. Avoid contact with eyes, ears mouth and   genitals (private parts).                       Wash face,  Genitals (private parts) with your normal soap.             6.  Wash thoroughly, paying special attention to the area where your    surgery  will be performed.  7.  Thoroughly rinse your body with warm water from the neck down.  8.  DO NOT shower/wash with your normal soap after using and rinsing off the CHG Soap.                9.  Pat yourself dry with a clean towel.            10.  Wear clean pajamas.            11.  Place clean sheets on your bed the night of your first shower and do not  sleep with  pets. Day of Surgery : Do not apply any lotions/deodorants the morning of surgery.  Please wear clean clothes to the hospital/surgery center.  FAILURE TO FOLLOW THESE INSTRUCTIONS MAY RESULT IN THE CANCELLATION OF YOUR SURGERY  PATIENT SIGNATURE_________________________________  NURSE SIGNATURE__________________________________  ________________________________________________________________________

## 2023-02-19 NOTE — Progress Notes (Signed)
COVID Vaccine Completed:  Date of COVID positive in last 90 days:  PCP - Howard Pouch, DO Cardiologist -   Chest x-ray -  EKG -  Stress Test -  ECHO - 2014 Cardiac Cath -  Pacemaker/ICD device last checked: Spinal Cord Stimulator:  Bowel Prep -   Sleep Study -  CPAP -   Fasting Blood Sugar -  Checks Blood Sugar _____ times a day  Last dose of GLP1 agonist-  N/A GLP1 instructions:  N/A   Last dose of SGLT-2 inhibitors-  N/A SGLT-2 instructions: N/A   Blood Thinner Instructions: Aspirin Instructions: Last Dose:  Activity level:  Can go up a flight of stairs and perform activities of daily living without stopping and without symptoms of chest pain or shortness of breath.  Able to exercise without symptoms  Unable to go up a flight of stairs without symptoms of     Anesthesia review:   Patient denies shortness of breath, fever, cough and chest pain at PAT appointment  Patient verbalized understanding of instructions that were given to them at the PAT appointment. Patient was also instructed that they will need to review over the PAT instructions again at home before surgery.

## 2023-02-19 NOTE — Progress Notes (Signed)
Please place orders for PAT appointment scheduled 02/22/23.

## 2023-02-20 ENCOUNTER — Ambulatory Visit (HOSPITAL_BASED_OUTPATIENT_CLINIC_OR_DEPARTMENT_OTHER)
Admission: RE | Admit: 2023-02-20 | Discharge: 2023-02-20 | Disposition: A | Discharge status: Home / Self Care | Payer: HMO | Source: Ambulatory Visit | Attending: Physician Assistant | Admitting: Physician Assistant

## 2023-02-20 DIAGNOSIS — R112 Nausea with vomiting, unspecified: Secondary | ICD-10-CM | POA: Diagnosis not present

## 2023-02-20 DIAGNOSIS — R160 Hepatomegaly, not elsewhere classified: Secondary | ICD-10-CM

## 2023-02-20 MED ORDER — GADOBUTROL 1 MMOL/ML IV SOLN
8.0000 mL | Freq: Once | INTRAVENOUS | Status: AC | PRN
  Administered 2023-02-20: 8 mL via INTRAVENOUS

## 2023-02-22 ENCOUNTER — Encounter (HOSPITAL_COMMUNITY)
Admission: RE | Admit: 2023-02-22 | Discharge: 2023-02-22 | Disposition: A | Discharge status: Home / Self Care | Payer: HMO | Source: Ambulatory Visit | Attending: General Surgery | Admitting: General Surgery

## 2023-02-22 ENCOUNTER — Other Ambulatory Visit: Payer: Self-pay

## 2023-02-22 ENCOUNTER — Encounter (HOSPITAL_COMMUNITY): Payer: Self-pay

## 2023-02-22 VITALS — BP 128/79 | HR 69 | Temp 98.3°F | Resp 16 | Ht 62.0 in | Wt 163.0 lb

## 2023-02-22 DIAGNOSIS — K769 Liver disease, unspecified: Secondary | ICD-10-CM | POA: Insufficient documentation

## 2023-02-22 DIAGNOSIS — Z01812 Encounter for preprocedural laboratory examination: Secondary | ICD-10-CM | POA: Diagnosis not present

## 2023-02-22 DIAGNOSIS — Z01818 Encounter for other preprocedural examination: Secondary | ICD-10-CM

## 2023-02-22 LAB — COMPREHENSIVE METABOLIC PANEL
ALT: 20 U/L (ref 0–44)
AST: 22 U/L (ref 15–41)
Albumin: 4.6 g/dL (ref 3.5–5.0)
Alkaline Phosphatase: 62 U/L (ref 38–126)
Anion gap: 9 (ref 5–15)
BUN: 14 mg/dL (ref 8–23)
CO2: 26 mmol/L (ref 22–32)
Calcium: 9.7 mg/dL (ref 8.9–10.3)
Chloride: 101 mmol/L (ref 98–111)
Creatinine, Ser: 0.68 mg/dL (ref 0.44–1.00)
GFR, Estimated: >60 mL/min (ref >60)
Glucose, Bld: 94 mg/dL (ref 70–99)
Potassium: 4.4 mmol/L (ref 3.5–5.1)
Sodium: 136 mmol/L (ref 135–145)
Total Bilirubin: 0.8 mg/dL (ref 0.3–1.2)
Total Protein: 7.4 g/dL (ref 6.5–8.1)

## 2023-02-22 LAB — CBC
HCT: 46.2 % — ABNORMAL HIGH (ref 36.0–46.0)
Hemoglobin: 15.1 g/dL — ABNORMAL HIGH (ref 12.0–15.0)
MCH: 29.4 pg (ref 26.0–34.0)
MCHC: 32.7 g/dL (ref 30.0–36.0)
MCV: 90.1 fL (ref 80.0–100.0)
Platelets: 331 10*3/uL (ref 150–400)
RBC: 5.13 MIL/uL — ABNORMAL HIGH (ref 3.87–5.11)
RDW: 13.2 % (ref 11.5–15.5)
WBC: 6.7 10*3/uL (ref 4.0–10.5)
nRBC: 0 % (ref 0.0–0.2)

## 2023-02-23 NOTE — Progress Notes (Signed)
Updated patient on arrival time of 1200 and allowed clear liquids until 1115. Patient verbalized understanding

## 2023-03-05 ENCOUNTER — Ambulatory Visit (HOSPITAL_COMMUNITY): Payer: HMO | Admitting: Anesthesiology

## 2023-03-05 ENCOUNTER — Ambulatory Visit: Payer: Self-pay | Admitting: General Surgery

## 2023-03-05 ENCOUNTER — Other Ambulatory Visit (HOSPITAL_COMMUNITY): Payer: Self-pay

## 2023-03-05 ENCOUNTER — Encounter (HOSPITAL_COMMUNITY)
Admission: RE | Disposition: A | Discharge status: Home / Self Care | Payer: Self-pay | Source: Ambulatory Visit | Attending: General Surgery

## 2023-03-05 ENCOUNTER — Other Ambulatory Visit: Payer: Self-pay

## 2023-03-05 ENCOUNTER — Encounter (HOSPITAL_COMMUNITY): Payer: Self-pay | Admitting: General Surgery

## 2023-03-05 ENCOUNTER — Ambulatory Visit (HOSPITAL_COMMUNITY)
Admission: RE | Admit: 2023-03-05 | Discharge: 2023-03-05 | Disposition: A | Discharge status: Home / Self Care | Payer: HMO | Source: Ambulatory Visit | Attending: General Surgery | Admitting: General Surgery

## 2023-03-05 ENCOUNTER — Ambulatory Visit (HOSPITAL_BASED_OUTPATIENT_CLINIC_OR_DEPARTMENT_OTHER): Payer: HMO | Admitting: Anesthesiology

## 2023-03-05 DIAGNOSIS — K8010 Calculus of gallbladder with chronic cholecystitis without obstruction: Secondary | ICD-10-CM

## 2023-03-05 DIAGNOSIS — K8020 Calculus of gallbladder without cholecystitis without obstruction: Secondary | ICD-10-CM | POA: Insufficient documentation

## 2023-03-05 DIAGNOSIS — K660 Peritoneal adhesions (postprocedural) (postinfection): Secondary | ICD-10-CM | POA: Insufficient documentation

## 2023-03-05 DIAGNOSIS — K769 Liver disease, unspecified: Secondary | ICD-10-CM | POA: Insufficient documentation

## 2023-03-05 DIAGNOSIS — Z01818 Encounter for other preprocedural examination: Secondary | ICD-10-CM

## 2023-03-05 HISTORY — PX: CHOLECYSTECTOMY: SHX55

## 2023-03-05 SURGERY — LAPAROSCOPIC CHOLECYSTECTOMY WITH INTRAOPERATIVE CHOLANGIOGRAM
Anesthesia: General | Site: Abdomen

## 2023-03-05 MED ORDER — BUPIVACAINE HCL 0.25 % IJ SOLN
INTRAMUSCULAR | Status: AC
  Filled 2023-03-05: qty 1

## 2023-03-05 MED ORDER — BUPIVACAINE HCL 0.25 % IJ SOLN
INTRAMUSCULAR | Status: DC | PRN
  Administered 2023-03-05: 30 mL

## 2023-03-05 MED ORDER — MIDAZOLAM HCL 2 MG/2ML IJ SOLN
INTRAMUSCULAR | Status: DC | PRN
  Administered 2023-03-05: 2 mg via INTRAVENOUS

## 2023-03-05 MED ORDER — LIDOCAINE HCL (PF) 2 % IJ SOLN
INTRAMUSCULAR | Status: AC
  Filled 2023-03-05: qty 5

## 2023-03-05 MED ORDER — SPY AGENT GREEN - (INDOCYANINE FOR INJECTION)
1.2500 mg | Freq: Once | INTRAMUSCULAR | Status: AC
  Administered 2023-03-05: 1.25 mg via INTRAVENOUS
  Filled 2023-03-05: qty 10

## 2023-03-05 MED ORDER — ONDANSETRON HCL 4 MG/2ML IJ SOLN
INTRAMUSCULAR | Status: AC
  Filled 2023-03-05: qty 2

## 2023-03-05 MED ORDER — FENTANYL CITRATE (PF) 100 MCG/2ML IJ SOLN
INTRAMUSCULAR | Status: DC | PRN
  Administered 2023-03-05 (×2): 50 ug via INTRAVENOUS

## 2023-03-05 MED ORDER — DEXAMETHASONE SODIUM PHOSPHATE 10 MG/ML IJ SOLN
INTRAMUSCULAR | Status: DC | PRN
  Administered 2023-03-05: 4 mg via INTRAVENOUS

## 2023-03-05 MED ORDER — MIDAZOLAM HCL 2 MG/2ML IJ SOLN
INTRAMUSCULAR | Status: AC
  Filled 2023-03-05: qty 2

## 2023-03-05 MED ORDER — ACETAMINOPHEN 500 MG PO TABS: 1000.0000 mg | ORAL_TABLET | Freq: Three times a day (TID) | ORAL | 0 refills | Status: AC

## 2023-03-05 MED ORDER — FENTANYL CITRATE PF 50 MCG/ML IJ SOSY
25.0000 ug | PREFILLED_SYRINGE | INTRAMUSCULAR | Status: DC | PRN
  Administered 2023-03-05: 50 ug via INTRAVENOUS

## 2023-03-05 MED ORDER — 0.9 % SODIUM CHLORIDE (POUR BTL) OPTIME
TOPICAL | Status: DC | PRN
  Administered 2023-03-05: 1000 mL

## 2023-03-05 MED ORDER — KETOROLAC TROMETHAMINE 30 MG/ML IJ SOLN
INTRAMUSCULAR | Status: DC | PRN
  Administered 2023-03-05: 15 mg via INTRAVENOUS

## 2023-03-05 MED ORDER — SUGAMMADEX SODIUM 200 MG/2ML IV SOLN
INTRAVENOUS | Status: DC | PRN
  Administered 2023-03-05: 200 mg via INTRAVENOUS

## 2023-03-05 MED ORDER — TRAMADOL HCL 50 MG PO TABS
50.0000 mg | ORAL_TABLET | Freq: Four times a day (QID) | ORAL | 0 refills | Status: AC | PRN
  Filled 2023-03-05: qty 15, 4d supply, fill #0

## 2023-03-05 MED ORDER — ONDANSETRON HCL 4 MG/2ML IJ SOLN
INTRAMUSCULAR | Status: DC | PRN
  Administered 2023-03-05: 4 mg via INTRAVENOUS

## 2023-03-05 MED ORDER — PROPOFOL 10 MG/ML IV BOLUS
INTRAVENOUS | Status: DC | PRN
  Administered 2023-03-05: 100 mg via INTRAVENOUS

## 2023-03-05 MED ORDER — PROPOFOL 10 MG/ML IV BOLUS
INTRAVENOUS | Status: AC
  Filled 2023-03-05: qty 20

## 2023-03-05 MED ORDER — CHLORHEXIDINE GLUCONATE 0.12 % MT SOLN: 15.0000 mL | Freq: Once | OROMUCOSAL | Status: DC

## 2023-03-05 MED ORDER — CHLORHEXIDINE GLUCONATE CLOTH 2 % EX PADS: 6.0000 | MEDICATED_PAD | Freq: Once | CUTANEOUS | Status: DC

## 2023-03-05 MED ORDER — LIDOCAINE 2% (20 MG/ML) 5 ML SYRINGE
INTRAMUSCULAR | Status: DC | PRN
  Administered 2023-03-05: 60 mg via INTRAVENOUS

## 2023-03-05 MED ORDER — ROCURONIUM BROMIDE 10 MG/ML (PF) SYRINGE
PREFILLED_SYRINGE | INTRAVENOUS | Status: AC
  Filled 2023-03-05: qty 10

## 2023-03-05 MED ORDER — FENTANYL CITRATE PF 50 MCG/ML IJ SOSY
PREFILLED_SYRINGE | INTRAMUSCULAR | Status: AC
  Administered 2023-03-05: 50 ug via INTRAVENOUS
  Filled 2023-03-05: qty 1

## 2023-03-05 MED ORDER — ORAL CARE MOUTH RINSE: 15.0000 mL | Freq: Once | OROMUCOSAL | Status: DC

## 2023-03-05 MED ORDER — DEXAMETHASONE SODIUM PHOSPHATE 10 MG/ML IJ SOLN
INTRAMUSCULAR | Status: AC
  Filled 2023-03-05: qty 1

## 2023-03-05 MED ORDER — FENTANYL CITRATE PF 50 MCG/ML IJ SOSY
PREFILLED_SYRINGE | INTRAMUSCULAR | Status: AC
  Administered 2023-03-05: 50 ug via INTRAVENOUS
  Filled 2023-03-05: qty 2

## 2023-03-05 MED ORDER — LACTATED RINGERS IR SOLN
Status: DC | PRN
  Administered 2023-03-05: 1000 mL

## 2023-03-05 MED ORDER — ROCURONIUM BROMIDE 10 MG/ML (PF) SYRINGE
PREFILLED_SYRINGE | INTRAVENOUS | Status: DC | PRN
  Administered 2023-03-05: 40 mg via INTRAVENOUS

## 2023-03-05 MED ORDER — ACETAMINOPHEN 500 MG PO TABS
1000.0000 mg | ORAL_TABLET | ORAL | Status: AC
  Administered 2023-03-05: 1000 mg via ORAL
  Filled 2023-03-05: qty 2

## 2023-03-05 MED ORDER — LACTATED RINGERS IV SOLN
INTRAVENOUS | Status: DC
Start: 2023-03-05 — End: 2023-03-05

## 2023-03-05 MED ORDER — KETOROLAC TROMETHAMINE 30 MG/ML IJ SOLN
INTRAMUSCULAR | Status: AC
  Filled 2023-03-05: qty 1

## 2023-03-05 MED ORDER — FENTANYL CITRATE (PF) 100 MCG/2ML IJ SOLN
INTRAMUSCULAR | Status: AC
  Filled 2023-03-05: qty 2

## 2023-03-05 MED ORDER — SODIUM CHLORIDE 0.9 % IV SOLN
2.0000 g | INTRAVENOUS | Status: AC
  Administered 2023-03-05: 2 g via INTRAVENOUS
  Filled 2023-03-05: qty 2

## 2023-03-05 SURGICAL SUPPLY — 56 items
ADH SKN CLS APL DERMABOND .7 (GAUZE/BANDAGES/DRESSINGS) ×1
APL PRP STRL LF DISP 70% ISPRP (MISCELLANEOUS) ×1
APL SRG 38 LTWT LNG FL B (MISCELLANEOUS)
APPLICATOR ARISTA FLEXITIP XL (MISCELLANEOUS) IMPLANT
APPLIER CLIP 5 13 M/L LIGAMAX5 (MISCELLANEOUS) ×1
APPLIER CLIP ROT 10 11.4 M/L (STAPLE)
APR CLP MED LRG 11.4X10 (STAPLE)
APR CLP MED LRG 5 ANG JAW (MISCELLANEOUS) ×1
BAG COUNTER SPONGE SURGICOUNT (BAG) IMPLANT
BAG SPEC RTRVL 10 TROC 200 (ENDOMECHANICALS) ×1
BAG SPNG CNTER NS LX DISP (BAG)
CABLE HIGH FREQUENCY MONO STRZ (ELECTRODE) ×2 IMPLANT
CHLORAPREP W/TINT 26 (MISCELLANEOUS) ×2 IMPLANT
CLIP APPLIE 5 13 M/L LIGAMAX5 (MISCELLANEOUS) IMPLANT
CLIP APPLIE ROT 10 11.4 M/L (STAPLE) IMPLANT
CLIP LIGATING HEMO O LOK GREEN (MISCELLANEOUS) IMPLANT
COVER MAYO STAND XLG (MISCELLANEOUS) IMPLANT
COVER SURGICAL LIGHT HANDLE (MISCELLANEOUS) ×2 IMPLANT
DERMABOND ADVANCED .7 DNX12 (GAUZE/BANDAGES/DRESSINGS) IMPLANT
DRAPE C-ARM 42X120 X-RAY (DRAPES) IMPLANT
DRSG TEGADERM 2-3/8X2-3/4 SM (GAUZE/BANDAGES/DRESSINGS) ×6 IMPLANT
DRSG TEGADERM 4X4.75 (GAUZE/BANDAGES/DRESSINGS) ×2 IMPLANT
ELECT REM PT RETURN 15FT ADLT (MISCELLANEOUS) ×2 IMPLANT
GAUZE SPONGE 2X2 8PLY STRL LF (GAUZE/BANDAGES/DRESSINGS) ×2 IMPLANT
GLOVE BIO SURGEON STRL SZ7.5 (GLOVE) ×2 IMPLANT
GLOVE INDICATOR 8.0 STRL GRN (GLOVE) ×2 IMPLANT
GOWN STRL REUS W/ TWL XL LVL3 (GOWN DISPOSABLE) ×2 IMPLANT
GOWN STRL REUS W/TWL XL LVL3 (GOWN DISPOSABLE) ×1
GRASPER SUT TROCAR 14GX15 (MISCELLANEOUS) IMPLANT
HEMOSTAT ARISTA ABSORB 3G PWDR (HEMOSTASIS) IMPLANT
HEMOSTAT SNOW SURGICEL 2X4 (HEMOSTASIS) IMPLANT
IRRIG SUCT STRYKERFLOW 2 WTIP (MISCELLANEOUS) ×1
IRRIGATION SUCT STRKRFLW 2 WTP (MISCELLANEOUS) ×2 IMPLANT
KIT BASIN OR (CUSTOM PROCEDURE TRAY) ×2 IMPLANT
KIT IMAGING PINPOINTPAQ (MISCELLANEOUS) IMPLANT
KIT TURNOVER KIT A (KITS) IMPLANT
L-HOOK LAP DISP 36CM (ELECTROSURGICAL)
LHOOK LAP DISP 36CM (ELECTROSURGICAL) IMPLANT
POUCH RETRIEVAL ECOSAC 10 (ENDOMECHANICALS) ×2 IMPLANT
POUCH RETRIEVAL ECOSAC 10MM (ENDOMECHANICALS) ×1
SCISSORS LAP 5X35 DISP (ENDOMECHANICALS) ×2 IMPLANT
SET CHOLANGIOGRAPH MIX (MISCELLANEOUS) IMPLANT
SET TUBE SMOKE EVAC HIGH FLOW (TUBING) ×2 IMPLANT
SLEEVE Z-THREAD 5X100MM (TROCAR) ×4 IMPLANT
SPIKE FLUID TRANSFER (MISCELLANEOUS) ×2 IMPLANT
STRIP CLOSURE SKIN 1/2X4 (GAUZE/BANDAGES/DRESSINGS) ×2 IMPLANT
SUT MNCRL AB 4-0 PS2 18 (SUTURE) ×2 IMPLANT
SUT VIC AB 0 UR5 27 (SUTURE) IMPLANT
SUT VICRYL 0 TIES 12 18 (SUTURE) IMPLANT
SUT VICRYL 0 UR6 27IN ABS (SUTURE) IMPLANT
TOWEL OR 17X26 10 PK STRL BLUE (TOWEL DISPOSABLE) ×2 IMPLANT
TOWEL OR NON WOVEN STRL DISP B (DISPOSABLE) ×2 IMPLANT
TRAY LAPAROSCOPIC (CUSTOM PROCEDURE TRAY) ×2 IMPLANT
TROCAR 11X100 Z THREAD (TROCAR) IMPLANT
TROCAR BALLN 12MMX100 BLUNT (TROCAR) ×2 IMPLANT
TROCAR Z-THREAD OPTICAL 5X100M (TROCAR) ×2 IMPLANT

## 2023-03-05 NOTE — Transfer of Care (Signed)
Immediate Anesthesia Transfer of Care Note  Patient: Crystal [name]  Procedure(s) Performed: LAPAROSCOPIC CHOLECYSTECTOMY WITH IICG DYE (Abdomen)  Patient Location: PACU  Anesthesia Type:General  Level of Consciousness: awake, alert , and oriented  Airway & Oxygen Therapy: Patient Spontanous Breathing and Patient connected to face mask oxygen  Post-op Assessment: Report given to RN, Post -op Vital signs reviewed and stable, and Patient moving all extremities X 4  Post vital signs: Reviewed and stable  Last Vitals:  Vitals Value Taken Time  BP 133/66 03/05/23 1607  Temp    Pulse 65 03/05/23 1609  Resp 20 03/05/23 1609  SpO2 100 % 03/05/23 1609  Vitals shown include unvalidated device data.  Last Pain:  Vitals:   03/05/23 1320  TempSrc:   PainSc: 0-No pain      Patients Stated Pain Goal: 3 (03/05/23 1320)  Complications: No notable events documented.

## 2023-03-05 NOTE — Discharge Instructions (Signed)
[facility] LAPAROSCOPIC SURGERY: POST OP INSTRUCTIONS Always review your discharge instruction sheet given to you by the facility where your surgery was performed. IF YOU HAVE DISABILITY OR FAMILY LEAVE FORMS, YOU MUST BRING THEM TO THE OFFICE FOR PROCESSING.   DO NOT GIVE THEM TO YOUR DOCTOR.  PAIN CONTROL  First take acetaminophen (Tylenol) AND/or ibuprofen (Advil) to control your pain after surgery.  Follow directions on package.  Taking acetaminophen (Tylenol) and/or ibuprofen (Advil) regularly after surgery will help to control your pain and lower the amount of prescription pain medication you may need.  You should not take more than 3,000 mg (3 grams) of acetaminophen (Tylenol) in 24 hours.  You should not take ibuprofen (Advil), aleve, motrin, naprosyn or other NSAIDS if you have a history of stomach ulcers or chronic kidney disease.  A prescription for pain medication may be given to you upon discharge.  Take your pain medication as prescribed, if you still have uncontrolled pain after taking acetaminophen (Tylenol) or ibuprofen (Advil). Use ice packs to help control pain. If you need a refill on your pain medication, please contact your pharmacy.  They will contact our office to request authorization. Prescriptions will not be filled after 5pm or on week-ends.  HOME MEDICATIONS Take your usually prescribed medications unless otherwise directed.  DIET You should follow a light diet the first few days after arrival home.  Be sure to include lots of fluids daily. Avoid fatty, fried foods.   CONSTIPATION It is common to experience some constipation after surgery and if you are taking pain medication.  Increasing fluid intake and taking a stool softener (such as Colace) will usually help or prevent this problem from occurring.  A mild laxative (Milk of Magnesia or Miralax) should be taken according to package instructions if there are no bowel movements after 48  hours.  WOUND/INCISION CARE Most patients will experience some swelling and bruising in the area of the incisions.  Ice packs will help.  Swelling and bruising can take several days to resolve.  Unless discharge instructions indicate otherwise, follow guidelines below  STERI-STRIPS - you may remove your outer bandages 48 hours after surgery, and you may shower at that time.  You have steri-strips (small skin tapes) in place directly over the incision.  These strips should be left on the skin for 7-10 days.   DERMABOND/SKIN GLUE - you may shower in 24 hours.  The glue will flake off over the next 2-3 weeks. Any sutures or staples will be removed at the office during your follow-up visit.  ACTIVITIES You may resume regular (light) daily activities beginning the next day--such as daily self-care, walking, climbing stairs--gradually increasing activities as tolerated.  You may have sexual intercourse when it is comfortable.  Refrain from any heavy lifting or straining until approved by your doctor. You may drive when you are no longer taking prescription pain medication, you can comfortably wear a seatbelt, and you can safely maneuver your car and apply brakes.  FOLLOW-UP You should see your doctor in the office for a follow-up appointment approximately 2-3 weeks after your surgery.  You should have been given your post-op/follow-up appointment when your surgery was scheduled.  If you did not receive a post-op/follow-up appointment, make sure that you call for this appointment within a day or two after you arrive home to insure a convenient appointment time.  OTHER INSTRUCTIONS   WHEN TO CALL YOUR DOCTOR: Fever over 101.0 Inability to urinate Continued   bleeding from incision. Increased pain, redness, or drainage from the incision. Increasing abdominal pain  The clinic staff is available to answer your questions during regular business hours.  Please don't hesitate to call and ask to speak to  one of the nurses for clinical concerns.  If you have a medical emergency, go to the nearest emergency room or call 911.  A surgeon from [facility] is always on call at the hospital. [address], [address], [place], [place]  [address] ? [address] [address], [place], [place]   [address] [phone] ? [phone] ? FAX [phone] Web site: [email or web address]  

## 2023-03-05 NOTE — Anesthesia Procedure Notes (Signed)
Procedure Name: Intubation Date/Time: 03/05/2023 2:43 PM  Performed by: Nelle Don, CRNAPre-anesthesia Checklist: Patient identified, Emergency Drugs available, Suction available and Patient being monitored Patient Re-evaluated:Patient Re-evaluated prior to induction Oxygen Delivery Method: Circle system utilized Preoxygenation: Pre-oxygenation with 100% oxygen Induction Type: IV induction Ventilation: Mask ventilation without difficulty Laryngoscope Size: Mac and 4 Grade View: Grade I Tube type: Oral Tube size: 7.5 mm Number of attempts: 1 Airway Equipment and Method: Stylet Placement Confirmation: ETT inserted through vocal cords under direct vision, positive ETCO2 and breath sounds checked- equal and bilateral Secured at: 22 cm Tube secured with: Tape Dental Injury: Teeth and Oropharynx as per pre-operative assessment

## 2023-03-05 NOTE — Interval H&P Note (Signed)
History and Physical Interval Note:  03/05/2023 2:26 PM  Crystal [name]  has presented today for surgery, with the diagnosis of Symptomatic cholelithiasis.  The various methods of treatment have been discussed with the patient and family. After consideration of risks, benefits and other options for treatment, the patient has consented to  Procedure(s): LAPAROSCOPIC CHOLECYSTECTOMY WITH IICG DYE (N/A) as a surgical intervention.  The patient's history has been reviewed, patient examined, no change in status, stable for surgery.  I have reviewed the patient's chart and labs.  Questions were answered to the patient's satisfaction.    Mary Sella. Andrey Campanile, MD, FACS General, Bariatric, & Minimally Invasive Surgery [facility],  [facility]  Gaynelle Adu

## 2023-03-05 NOTE — H&P (Signed)
REFERRING PHYSICIAN: Roxy CedarPerry, John N Jr., MD  PROVIDER: [place] Gentle Sherril CongMCADAMS Kluesner, MD  MRN: [id number] DOB: 07/10/1956 DATE OF ENCOUNTER: 02/19/2023  Subjective  Chief Complaint: New Consultation (Consideration of cholecystectomy)   History of Present Illness: Crystal [name] is a 67 y.o. female who is seen today as an office consultation at the request of Dr. Marina GoodellPerry for evaluation of New Consultation (Consideration of cholecystectomy) .  States that she has been having upper abdominal pain intermittently for the past several years. She describes it as tightness in her lower chest which then proceeds to a bandlike vice sensation around her upper abdomen that goes across her entire abdomen going to her back. She will get bloated and her stomach will get hard. This will be followed with nausea and vomiting for about 45 minutes. Initially her symptoms were very sporadic and occurring maybe every other month once a month. They have started increased in frequency but are still not weekly or daily. Her last event was in February which prompted her to see a PCP in March to discuss that. No hematemesis. No melena hematochezia. Daily bowel movements. No NSAID use. No dysphagia. If she had to pick 1 location where the discomfort is most intense is on her right upper side. It will last for a few hours before completely going away. She has had a CT scan, labs and ultrasound  Is accompanied by her husband    Review of Systems: A complete review of systems was obtained from the patient. I have reviewed this information and discussed as appropriate with the patient. See HPI as well for other ROS.  ROS  Medical History: History reviewed. No pertinent past medical history.  There is no problem list on file for this patient.  Past Surgical History: Procedure Laterality Date APPENDECTOMY BREAST EXCISIONAL BIOPSY HYSTERECTOMY   Allergies Allergen Reactions Dilaudid [Hydromorphone] Other (See Comments) When  mixed together coma 2009 Phenergan [Promethazine] Other (See Comments)  No current outpatient medications on file prior to visit.  No current facility-administered medications on file prior to visit.  Family History Problem Relation Age of Onset Breast cancer Sister   Social History  Tobacco Use Smoking Status Never Smokeless Tobacco Never   Social History  Socioeconomic History Marital status: Married Tobacco Use Smoking status: Never Smokeless tobacco: Never Vaping Use Vaping Use: Never used Substance and Sexual Activity Alcohol use: Yes Drug use: Never  Objective:  Vitals: 02/19/23 0921 BP: 130/75 Pulse: 80 Temp: 36.8 C (98.2 F) SpO2: 99% Weight: 75.9 kg (167 lb 6.4 oz) Height: 157.5 cm (5\' 2" )  Body mass index is 30.62 kg/m.  Constitutional: NAD; conversant; no deformities Eyes: Moist conjunctiva; no lid lag; anicteric; PERRL Neck: Trachea midline; no thyromegaly Lungs: Normal respiratory effort; no tactile fremitus CV: RRR; no palpable thrills; no pitting edema GI: Abd soft, nt, nd; no palpable hepatosplenomegaly MSK: Normal gait; no clubbing/cyanosis Psychiatric: Appropriate affect; alert and oriented x3 Lymphatic: No palpable cervical or axillary lymphadenopathy Skin:no rash/lesions  Labs, Imaging and Diagnostic Testing: GI office note February 08, 2023 PCP office note February 01, 2023  Abdominal ultrasound February 10, 2023  IMPRESSION: 1. Gallbladder is favored contracted around multiple shadowing gallstones. No sonographic evidence of acute cholecystitis. 2. There is a 17 mm hypoechoic masslike area in the RIGHT liver which is indeterminate. This may reflect underlying focal fatty sparing but mass is not excluded. Recommend further evaluation with dedicated liver protocol MRI or CT with and without contrast. 3. Heterogeneous and mildly increased parenchymal echogenicity of  the liver which may reflect hepatic steatosis.  CT scan February 03, 2023  CBC, comprehensive metabolic panel, lipase, C-reactive protein February 01, 2023  Assessment and Plan:   Diagnoses and all orders for this visit:  Symptomatic cholelithiasis  Liver lesion, right lobe    I believe the patient's symptoms are consistent with gallbladder disease.  We discussed gallbladder disease. The patient was given Agricultural engineer. We discussed non-operative and operative management. We discussed the signs & symptoms of acute cholecystitis  I discussed laparoscopic cholecystectomy with possible IOC in detail. The patient was given educational material as well as diagrams detailing the procedure. We discussed the risks and benefits of a laparoscopic cholecystectomy including, but not limited to bleeding, infection, injury to surrounding structures such as the intestine or liver, bile leak, retained gallstones, need to convert to an open procedure, prolonged diarrhea, blood clots such as DVT, common bile duct injury, anesthesia risks, and possible need for additional procedures. We discussed the typical post-operative recovery course. I explained that the likelihood of improvement of their symptoms is good.  I am not terribly concerned about the small liver lesion seen on [facility] but agree follow-up imaging is indicated. We will go ahead and let the patient schedule for lap chole but we will follow-up on the MRI prior to her surgery just to make sure it is a benign lesion and/or just focal fat sparing.  Moderate complexity discussed to medical diagnoses 1 of which is chronic and has some exacerbation, moderate medical data review as well as decision regarding surgery  No follow-ups on file.  Clarkson Rosselli Sherril Cong, MD General, Minimally Invasive, & Bariatric Surgery     Electronically signed by Gara Kroner, MD at 02/19/2023 9:54 AM EDT

## 2023-03-05 NOTE — Anesthesia Postprocedure Evaluation (Signed)
Anesthesia Post Note  Patient: Crystal [name]  Procedure(s) Performed: LAPAROSCOPIC CHOLECYSTECTOMY WITH IICG DYE (Abdomen)     Patient location during evaluation: PACU Anesthesia Type: General Level of consciousness: awake and alert and oriented Pain management: pain level controlled Vital Signs Assessment: post-procedure vital signs reviewed and stable Respiratory status: spontaneous breathing, nonlabored ventilation and respiratory function stable Cardiovascular status: blood pressure returned to baseline and stable Postop Assessment: no apparent nausea or vomiting Anesthetic complications: no   No notable events documented.  Last Vitals:  Vitals:   03/05/23 1645 03/05/23 1657  BP: 117/62 116/73  Pulse: 63 85  Resp: 17 20  Temp:  (!) 36.4 C  SpO2: 100% 97%    Last Pain:  Vitals:   03/05/23 1657  TempSrc: Oral  PainSc: 4                  Jaanai Salemi A.

## 2023-03-05 NOTE — Anesthesia Preprocedure Evaluation (Addendum)
Anesthesia Evaluation  Patient identified by MRN, date of birth, ID band Patient awake    Reviewed: Allergy & Precautions, H&P , NPO status , Patient's Chart, lab work & pertinent test results  Airway Mallampati: II  TM Distance: >3 FB Neck ROM: Full    Dental no notable dental hx. (+) Teeth Intact, Dental Advisory Given   Pulmonary neg pulmonary ROS   Pulmonary exam normal breath sounds clear to auscultation       Cardiovascular negative cardio ROS  Rhythm:Regular Rate:Normal     Neuro/Psych negative neurological ROS  negative psych ROS   GI/Hepatic Neg liver ROS,GERD  ,,  Endo/Other  negative endocrine ROS    Renal/GU negative Renal ROS  negative genitourinary   Musculoskeletal   Abdominal   Peds  Hematology negative hematology ROS (+)   Anesthesia Other Findings   Reproductive/Obstetrics negative OB ROS                             Anesthesia Physical Anesthesia Plan  ASA: 2  Anesthesia Plan: General   Post-op Pain Management: Ofirmev IV (intra-op)*   Induction: Intravenous  PONV Risk Score and Plan: 4 or greater and Ondansetron and Dexamethasone  Airway Management Planned: Oral ETT  Additional Equipment:   Intra-op Plan:   Post-operative Plan: Extubation in OR  Informed Consent: I have reviewed the patients History and Physical, chart, labs and discussed the procedure including the risks, benefits and alternatives for the proposed anesthesia with the patient or authorized representative who has indicated his/her understanding and acceptance.     Dental advisory given  Plan Discussed with: CRNA  Anesthesia Plan Comments:        Anesthesia Quick Evaluation

## 2023-03-05 NOTE — Op Note (Signed)
Crystal [name] [id number] Apr 04, 1956 03/05/2023  Laparoscopic Cholecystectomy with near infrared fluorescent cholangiography procedure Note  Indications: This patient presents with symptomatic gallbladder disease and will undergo laparoscopic cholecystectomy.  Pre-operative Diagnosis: Symptomatic cholelithiasis  Post-operative Diagnosis: Same, probable chronic calculus cholecystitis  Surgeon: Gaynelle Adu MD FACS  Assistants: None  Anesthesia: General endotracheal anesthesia   Procedure Details  The patient was seen again in the Holding Room. The risks, benefits, complications, treatment options, and expected outcomes were discussed with the patient. The possibilities of reaction to medication, pulmonary aspiration, perforation of viscus, bleeding, recurrent infection, finding a normal gallbladder, the need for additional procedures, failure to diagnose a condition, the possible need to convert to an open procedure, and creating a complication requiring transfusion or operation were discussed with the patient. The likelihood of improving the patient's symptoms with return to their baseline status is good.  The patient and/or family concurred with the proposed plan, giving informed consent. The site of surgery properly noted. The patient was taken to Operating Room, identified as Crystal [name] and the procedure verified as Laparoscopic Cholecystectomy with ICG dye.  A Time Out was held and the above information confirmed. Antibiotic prophylaxis was administered.    ICG dye was administered preoperatively.    General endotracheal anesthesia was then administered and tolerated well. After the induction, the abdomen was prepped with Chloraprep and draped in the sterile fashion. The patient was positioned in the supine position.  Local anesthetic agent was injected into the skin near the umbilicus and an incision made. We dissected down to the abdominal fascia with blunt dissection.  The  fascia was incised vertically and we entered the peritoneal cavity bluntly.  However I felt what appeared to be omental adhesions.  The patient had had a small scar at her umbilicus.  Therefore I decided to gain access to the abdomen using the Optiview technique.  A small incision was made in the left upper quadrant slightly to the left of the midline just below the left subcostal margin.  Using a 0 degree 5 mm laparoscope through a 5 mm trocar I advanced it through all layers of the abdominal wall and carefully entered the abdominal cavity.  Optical entry site was inspected and there was no evidence of injury to surrounding structures after pneumoperitoneum had been established.  The laparoscope was advanced and the abdominal cavity was surveilled.  The patient had omental adhesions around her umbilicus.  Two 5-mm ports were placed in the right upper quadrant.  Using EndoShears with electrocautery I took down some of the omental adhesions from the anterior domino wall around the umbilicus in order to open up the space for my trocar.  A pursestring suture of 0-Vicryl was placed around the fascial opening.  The Hasson cannula was inserted and secured with the stay suture.   All skin incisions were infiltrated with a local anesthetic agent before making the incision and placing the trocars.   We positioned the patient in reverse Trendelenburg, tilted slightly to the patient's left.  The patient had some dense adhesions between the anterior surface of her right lobe of the liver and her rib cage.  I did not take these down.  The liver was actually somewhat reflected upward exposing the gallbladder.  There was a fair amount of the omentum mildly adhered to the gallbladder and it was taken down with hook electrocautery.  The gallbladder was distended so I aspirated the gallbladder to facilitate retraction.  The gallbladder was identified, the  fundus grasped and retracted cephalad. Adhesions were lysed bluntly and with  the electrocautery where indicated, taking care not to injure any adjacent organs or viscus. The infundibulum was grasped and retracted laterally, exposing the peritoneum overlying the triangle of Calot. This was then divided and exposed in a blunt fashion. A critical view of the cystic duct and cystic artery was obtained.  The patient had a very short cystic duct.  I could see the cystic duct going into the common bile duct.  The cystic duct was clearly identified and bluntly dissected circumferentially.  Utilizing the Stryker camera system near infrared fluorescent activity was visualized in the liver, cystic duct, common hepatic duct and common bile duct and small bowel.  This served as a secondary confirmation of our anatomy.  The cystic duct was then ligated with clips and divided.  Proximal clip on the cystic duct was placed and parallel to the common bile duct.  The clip was not narrowing the common bile duct.  The cystic artery which had been identified & dissected free was ligated with clips and divided as well.   The gallbladder was dissected from the liver bed in retrograde fashion with the electrocautery. The gallbladder was removed and placed in an Ecco sac.  The gallbladder and Ecco sac were then removed through the umbilical port site. The liver bed was irrigated and inspected. Hemostasis was achieved with the electrocautery. Copious irrigation was utilized and was repeatedly aspirated until clear.  The pursestring suture was used to close the umbilical fascia.    We again inspected the right upper quadrant for hemostasis.  The umbilical closure was inspected and there was an air leak and nothing trapped within the closure.  I placed 2 additional interrupted 0 Vicryl's using the PMI suture passer with laparoscopic guidance.  There is no additional Airlink and the closure felt snug and tight.  Pneumoperitoneum was released as we removed the trocars.  4-0 Monocryl was used to close the skin.    Dermabond was applied. The patient was then extubated and brought to the recovery room in stable condition. Instrument, sponge, and needle counts were correct at closure and at the conclusion of the case.   Findings: Chronic Cholecystitis with Cholelithiasis Positive critical view Near infrared fluorescent cholangiography performed demonstrating activity in the common hepatic duct, common bile duct, small bowel and cystic duct.  The patient had a very short cystic duct.  There were no other structures entering the gallbladder other than the cystic duct and the cystic artery.  Estimated Blood Loss: Minimal         Drains: none         Specimens: Gallbladder           Complications: None; patient tolerated the procedure well.         Disposition: PACU - hemodynamically stable.         Condition: stable  Mary SellaEric M. Andrey CampanileWilson, MD, FACS General, Bariatric, & Minimally Invasive Surgery [facility],  [facility]

## 2023-03-06 ENCOUNTER — Encounter (HOSPITAL_COMMUNITY): Payer: Self-pay | Admitting: General Surgery

## 2023-03-10 LAB — SURGICAL PATHOLOGY

## 2023-03-17 ENCOUNTER — Ambulatory Visit (INDEPENDENT_AMBULATORY_CARE_PROVIDER_SITE_OTHER): Payer: HMO

## 2023-03-17 DIAGNOSIS — Z0000 Encounter for general adult medical examination without abnormal findings: Secondary | ICD-10-CM | POA: Diagnosis not present

## 2023-03-17 NOTE — Progress Notes (Signed)
Subjective:   Crystal [name] is a 66 y.o. female who presents for an Initial Medicare Annual Wellness Visit.  Review of Systems    I connected with  Pierce Crane on 03/17/23 by an audio only telemedicine application and verified that I am speaking with the correct person using two identifiers.   I discussed the limitations, risks, security and privacy concerns of performing an evaluation and management service by telephone and the availability of in person appointments. I also discussed with the patient that there may be a patient responsible charge related to this service. The patient expressed understanding and verbally consented to this telephonic visit.  Location of Patient: Home  Location of Provider: office  List any persons and their role that are participating in the visit with the patient.   Pattye Maybury Rocky Crafts     Objective:    Today's Vitals   03/17/23 0921  PainSc: 0-No pain   There is no height or weight on file to calculate BMI.     03/17/2023    9:26 AM 02/22/2023   11:26 AM 02/25/2018   10:50 AM 02/11/2018    7:58 AM 12/10/2015   11:00 AM 12/05/2015   12:17 PM 12/07/2012    3:40 PM  Advanced Directives  Does Patient Have a Medical Advance Directive? Yes No No No No No Patient does not have advance directive;Patient would not like information  Type of Advance Directive Healthcare Power of Attorney        Does patient want to make changes to medical advance directive? No - Patient declined        Copy of Healthcare Power of Attorney in Chart? No - copy requested        Would patient like information on creating a medical advance directive?  No - Patient declined   No - patient declined information    Pre-existing out of facility DNR order (yellow form or pink MOST form)       No    Current Medications (verified) Outpatient Encounter Medications as of 03/17/2023  Medication Sig   Calcium Carb-Cholecalciferol (CALCIUM 600 + D PO) Take 1 tablet by  mouth every other day.   carboxymethylcellulose (REFRESH TEARS) 0.5 % SOLN Place 1 drop into both eyes daily.   cetirizine (ZYRTEC) 10 MG tablet Take 10 mg by mouth daily as needed for allergies.   conjugated estrogens (PREMARIN) vaginal cream Place 1 applicator vaginally daily as needed (irrititation / dryness).   ibuprofen (ADVIL) 200 MG tablet Take 200-400 mg by mouth every 6 (six) hours as needed for moderate pain.   MegaRed Omega-3 Krill Oil 350 MG CAPS Take 350 mg by mouth daily.   Multiple Vitamin (MULTIVITAMIN ADULT) TABS Take 1 tablet by mouth daily.   Probiotic TBEC Take 1 capsule by mouth daily.   Quercetin 500 MG CAPS Take 500 mg by mouth daily.   zinc gluconate 50 MG tablet Take 50 mg by mouth daily.   No facility-administered encounter medications on file as of 03/17/2023.    Allergies (verified) Hydromorphone, Meperidine, Phenergan [promethazine hcl], Promethazine, and Pollen extract   History: Past Medical History:  Diagnosis Date   Acute respiratory failure with hypoxia 12/07/2012   Atypical ductal hyperplasia of breast 01/08/2016   Chicken pox    Colon polyps    Diverticulitis    may 2021   GERD (gastroesophageal reflux disease)    Pleural effusion 12/07/2012   Pneumonia    11/2010;06/2012;11/2012   Past  Surgical History:  Procedure Laterality Date   ABDOMINAL HYSTERECTOMY     2009   APPENDECTOMY     2016   BREAST BIOPSY Left 09/10/2006   BREAST BIOPSY Left    2016;2017   CHOLECYSTECTOMY N/A 03/05/2023   Procedure: LAPAROSCOPIC CHOLECYSTECTOMY WITH IICG DYE;  Surgeon: Gaynelle Adu, MD;  Location: [facility];  Service: General;  Laterality: N/A;   COLONOSCOPY     2019   dental implant     RADIOACTIVE SEED GUIDED EXCISIONAL BREAST BIOPSY Left 12/10/2015   Procedure: LEFT BREAST RADIOACTIVE SEED GUIDED EXCISIONAL BIOPSY;  Surgeon: Emelia Loron, MD;  Location: [facility];  Service: General;  Laterality: Left;   Family History  Problem  Relation Age of Onset   Heart disease Mother    Heart failure Mother    Pancreatic cancer Father    Alcohol abuse Father    Breast cancer Sister    Hyperlipidemia Sister    Hypertension Brother    Alcohol abuse Brother    Colon cancer Neg Hx    Colon polyps Neg Hx    Rectal cancer Neg Hx    Stomach cancer Neg Hx    Esophageal cancer Neg Hx    Social History   Socioeconomic History   Marital status: Married    Spouse name: Not on file   Number of children: 0   Years of education: Not on file   Highest education level: Not on file  Occupational History   Occupation: retired  Tobacco Use   Smoking status: Never    Passive exposure: Never   Smokeless tobacco: Never  Vaping Use   Vaping Use: Never used  Substance and Sexual Activity   Alcohol use: Yes    Comment: 1 -2 bers a day   Drug use: Never   Sexual activity: Yes    Partners: Male    Comment: married  Other Topics Concern   Not on file  Social History Narrative   Marital status/children/pets: Married   Education/employment: 12th grade graduate, retired   Field seismologist:      -smoke alarm in the home:Yes     - wears seatbelt: Yes     - Feels safe in their relationships: Yes      Social Determinants of Health   Financial Resource Strain: Low Risk  (03/17/2023)   Overall Financial Resource Strain (CARDIA)    Difficulty of Paying Living Expenses: Not hard at all  Food Insecurity: No Food Insecurity (03/17/2023)   Hunger Vital Sign    Worried About Running Out of Food in the Last Year: Never true    Ran Out of Food in the Last Year: Never true  Transportation Needs: No Transportation Needs (03/17/2023)   PRAPARE - Administrator, Civil Service (Medical): No    Lack of Transportation (Non-Medical): No  Physical Activity: Sufficiently Active (03/17/2023)   Exercise Vital Sign    Days of Exercise per Week: 7 days    Minutes of Exercise per Session: 30 min  Stress: No Stress Concern Present (03/17/2023)    [employer] of Occupational Health - Occupational Stress Questionnaire    Feeling of Stress : Not at all  Social Connections: Moderately Isolated (03/17/2023)   Social Connection and Isolation Panel [NHANES]    Frequency of Communication with Friends and Family: More than three times a week    Frequency of Social Gatherings with Friends and Family: Three times a week    Attends Religious Services: Never  Active Member of Clubs or Organizations: No    Attends Banker Meetings: Never    Marital Status: Married    Tobacco Counseling Counseling given: Not Answered   Clinical Intake:  Pre-visit preparation completed: No  Pain : No/denies pain Pain Score: 0-No pain        How often do you need to have someone help you when you read instructions, pamphlets, or other written materials from your doctor or pharmacy?: 1 - Never  Diabetic?No         Activities of Daily Living    03/17/2023    9:26 AM 02/22/2023   11:28 AM  In your present state of health, do you have any difficulty performing the following activities:  Hearing? 0   Vision? 0   Difficulty concentrating or making decisions? 0   Walking or climbing stairs? 0   Dressing or bathing? 0   Doing errands, shopping? 0 0  Preparing Food and eating ? N   Using the Toilet? N   In the past six months, have you accidently leaked urine? N   Do you have problems with loss of bowel control? N   Managing your Medications? N   Managing your Finances? N   Housekeeping or managing your Housekeeping? N     Patient Care Team: Natalia Leatherwood, DO as PCP - General (Family Medicine) [facility] (Ophthalmology) Shauna Hugh, MD as Referring Physician (General Surgery) Wynona Canes, MD as Referring Physician (Specialist) Yancey Flemings as Consulting Physician (Gastroenterology)  Indicate any recent Medical Services you may have received from other than [facility] providers in the past year (date  may be approximate).     Assessment:   This is a routine wellness examination for Aaniya.  Hearing/Vision screen No results found.  Dietary issues and exercise activities discussed: Current Exercise Habits: Home exercise routine, Type of exercise: walking, Time (Minutes): 30, Frequency (Times/Week): 7, Weekly Exercise (Minutes/Week): 210, Exercise limited by: None identified   Goals Addressed             This Visit's Progress    Patient Stated       Stay healthy      Depression Screen    03/17/2023    9:25 AM 02/01/2023   10:09 AM  PHQ 2/9 Scores  PHQ - 2 Score 0 0    Fall Risk    02/01/2023   10:09 AM  Fall Risk   Falls in the past year? 0  Number falls in past yr: 0  Injury with Fall? 0  Follow up Falls evaluation completed    FALL RISK PREVENTION PERTAINING TO THE HOME:  Any stairs in or around the home? No  If so, are there any without handrails? No  Home free of loose throw rugs in walkways, pet beds, electrical cords, etc? Yes  Adequate lighting in your home to reduce risk of falls? Yes   ASSISTIVE DEVICES UTILIZED TO PREVENT FALLS:  Life alert? No  Use of a cane, walker or w/c? No  Grab bars in the bathroom? No  Shower chair or bench in shower? No  Elevated toilet seat or a handicapped toilet? No   TIMED UP AND GO:  Was the test performed? No .  Length of time to ambulate 10 feet: n/a sec.     Cognitive Function:        03/17/2023    9:27 AM  6CIT Screen  What Year? 0 points  What month?  0 points  What time? 0 points  Count back from 20 0 points  Months in reverse 0 points    Immunizations Immunization History  Administered Date(s) Administered   DTaP 12/15/2012   Fluad Quad(high Dose 65+) 08/30/2022   Influenza Inj Mdck Quad Pf 09/02/2020   Influenza Split 08/29/2012   Influenza,inj,Quad PF,6+ Mos 09/25/2021, 09/17/2022   Pneumococcal Conjugate-13 11/28/2020   Pneumococcal Polysaccharide-23 08/25/2012   Respiratory Syncytial  Virus Vaccine,Recomb Aduvanted(Arexvy) 11/13/2022   Tdap 10/30/2014   Zoster Recombinat (Shingrix) 09/25/2021, 11/25/2022    TDAP status: Up to date  Flu Vaccine status: Up to date  Pneumococcal vaccine status: Up to date  Covid-19 vaccine status: Completed vaccines  Qualifies for Shingles Vaccine? Yes   Zostavax completed No   Shingrix Completed?: Yes  Screening Tests Health Maintenance  Topic Date Due   Hepatitis C Screening  Never done   COLONOSCOPY (Pts 45-80yrs Insurance coverage will need to be confirmed)  04/01/2023 (Originally 02/26/2023)   INFLUENZA VACCINE  07/01/2023   Medicare Annual Wellness (AWV)  03/16/2024   MAMMOGRAM  06/30/2024   DTaP/Tdap/Td (3 - Td or Tdap) 10/30/2024   Pneumonia Vaccine 21+ Years old (3 of 3 - PPSV23 or PCV20) 11/28/2025   DEXA SCAN  Completed   Zoster Vaccines- Shingrix  Completed   HPV VACCINES  Aged Out   COVID-19 Vaccine  Discontinued    Health Maintenance  Health Maintenance Due  Topic Date Due   Hepatitis C Screening  Never done    Colorectal cancer screening: Type of screening: Colonoscopy. Completed 02/25/2018. Repeat every 5 years  Mammogram status: Completed 06/30/22. Repeat every year  Bone Density status: Completed 05/19/2018. Results reflect: Bone density results: OSTEOPENIA. Repeat every 2 years.  Lung Cancer Screening: (Low Dose CT Chest recommended if Age 104-80 years, 30 pack-year currently smoking OR have quit w/in 15years.) does not qualify.   Lung Cancer Screening Referral: n/a  Additional Screening:  Hepatitis C Screening: does qualify; Completed n/a  Vision Screening: Recommended annual ophthalmology exams for early detection of glaucoma and other disorders of the eye. Is the patient up to date with their annual eye exam?  Yes  Who is the provider or what is the name of the office in which the patient attends annual eye exams? [facility] If pt is not established with a provider, would they like to  be referred to a provider to establish care? No .   Dental Screening: Recommended annual dental exams for proper oral hygiene  Community Resource Referral / Chronic Care Management: CRR required this visit?  No   CCM required this visit?  No      Plan:     I have personally reviewed and noted the following in the patient's chart:   Medical and social history Use of alcohol, tobacco or illicit drugs  Current medications and supplements including opioid prescriptions. Patient is not currently taking opioid prescriptions. Functional ability and status Nutritional status Physical activity Advanced directives List of other physicians Hospitalizations, surgeries, and ER visits in previous 12 months Vitals Screenings to include cognitive, depression, and falls Referrals and appointments  In addition, I have reviewed and discussed with patient certain preventive protocols, quality metrics, and best practice recommendations. A written personalized care plan for preventive services as well as general preventive health recommendations were provided to patient.     Filomena Jungling, CMA   03/17/2023   Nurse Notes: Non-Face to Face or Face to Face 8 minute visit  Encounter    Ms. Cobern , Thank you for taking time to come for your Medicare Wellness Visit. I appreciate your ongoing commitment to your health goals. Please review the following plan we discussed and let me know if I can assist you in the future.   These are the goals we discussed:  Goals      Patient Stated     Stay healthy        This is a list of the screening recommended for you and due dates:  Health Maintenance  Topic Date Due   Hepatitis C Screening: USPSTF Recommendation to screen - Ages 76-79 yo.  Never done   Colon Cancer Screening  04/01/2023*   Flu Shot  07/01/2023   Medicare Annual Wellness Visit  03/16/2024   Mammogram  06/30/2024   DTaP/Tdap/Td vaccine (3 - Td or Tdap) 10/30/2024   Pneumonia  Vaccine (3 of 3 - PPSV23 or PCV20) 11/28/2025   DEXA scan (bone density measurement)  Completed   Zoster (Shingles) Vaccine  Completed   HPV Vaccine  Aged Out   COVID-19 Vaccine  Discontinued  *Topic was postponed. The date shown is not the original due date.

## 2023-03-17 NOTE — Patient Instructions (Signed)

## 2023-03-22 DIAGNOSIS — D224 Melanocytic nevi of scalp and neck: Secondary | ICD-10-CM | POA: Diagnosis not present

## 2023-03-22 DIAGNOSIS — L821 Other seborrheic keratosis: Secondary | ICD-10-CM | POA: Diagnosis not present

## 2023-03-22 DIAGNOSIS — L814 Other melanin hyperpigmentation: Secondary | ICD-10-CM | POA: Diagnosis not present

## 2023-03-22 DIAGNOSIS — D225 Melanocytic nevi of trunk: Secondary | ICD-10-CM | POA: Diagnosis not present

## 2023-03-30 ENCOUNTER — Encounter: Payer: Self-pay | Admitting: Internal Medicine

## 2023-04-12 ENCOUNTER — Encounter: Payer: Self-pay | Admitting: Internal Medicine

## 2023-05-17 ENCOUNTER — Ambulatory Visit (AMBULATORY_SURGERY_CENTER): Payer: HMO

## 2023-05-17 ENCOUNTER — Encounter: Payer: Self-pay | Admitting: Internal Medicine

## 2023-05-17 VITALS — Ht 62.0 in | Wt 164.0 lb

## 2023-05-17 DIAGNOSIS — Z86010 Personal history of colonic polyps: Secondary | ICD-10-CM

## 2023-05-17 MED ORDER — NA SULFATE-K SULFATE-MG SULF 17.5-3.13-1.6 GM/177ML PO SOLN
1.0000 | Freq: Once | ORAL | 0 refills | Status: AC
Start: 2023-05-17 — End: 2023-05-17

## 2023-05-17 NOTE — Progress Notes (Signed)

## 2023-05-24 ENCOUNTER — Other Ambulatory Visit: Payer: Self-pay | Admitting: Family Medicine

## 2023-05-24 DIAGNOSIS — Z0000 Encounter for general adult medical examination without abnormal findings: Secondary | ICD-10-CM

## 2023-06-07 ENCOUNTER — Ambulatory Visit (AMBULATORY_SURGERY_CENTER): Payer: HMO | Admitting: Internal Medicine

## 2023-06-07 ENCOUNTER — Encounter: Payer: Self-pay | Admitting: Internal Medicine

## 2023-06-07 VITALS — BP 93/48 | HR 65 | Temp 97.3°F | Resp 15 | Ht 62.0 in | Wt 164.0 lb

## 2023-06-07 DIAGNOSIS — Z09 Encounter for follow-up examination after completed treatment for conditions other than malignant neoplasm: Secondary | ICD-10-CM | POA: Diagnosis not present

## 2023-06-07 DIAGNOSIS — D123 Benign neoplasm of transverse colon: Secondary | ICD-10-CM | POA: Diagnosis not present

## 2023-06-07 DIAGNOSIS — Z86010 Personal history of colonic polyps: Secondary | ICD-10-CM

## 2023-06-07 MED ORDER — SODIUM CHLORIDE 0.9 % IV SOLN
500.0000 mL | Freq: Once | INTRAVENOUS | Status: DC
Start: 2023-06-07 — End: 2023-06-07

## 2023-06-07 NOTE — Patient Instructions (Signed)
Handout on polyps and diverticulosis given to patient. Await pathology results. Resume previous diet and continue present medications. Repeat colonoscopy in 5 years for surveillance.   YOU HAD AN ENDOSCOPIC PROCEDURE TODAY AT THE [facility]:   Refer to the procedure report that was given to you for any specific questions about what was found during the examination.  If the procedure report does not answer your questions, please call your gastroenterologist to clarify.  If you requested that your care partner not be given the details of your procedure findings, then the procedure report has been included in a sealed envelope for you to review at your convenience later.  YOU SHOULD EXPECT: Some feelings of bloating in the abdomen. Passage of more gas than usual.  Walking can help get rid of the air that was put into your GI tract during the procedure and reduce the bloating. If you had a lower endoscopy (such as a colonoscopy or flexible sigmoidoscopy) you may notice spotting of blood in your stool or on the toilet paper. If you underwent a bowel prep for your procedure, you may not have a normal bowel movement for a few days.  Please Note:  You might notice some irritation and congestion in your nose or some drainage.  This is from the oxygen used during your procedure.  There is no need for concern and it should clear up in a day or so.  SYMPTOMS TO REPORT IMMEDIATELY:  Following lower endoscopy (colonoscopy or flexible sigmoidoscopy):  Excessive amounts of blood in the stool  Significant tenderness or worsening of abdominal pains  Swelling of the abdomen that is new, acute  Fever of 100F or higher   For urgent or emergent issues, a gastroenterologist can be reached at any hour by calling [phone]. Do not use MyChart messaging for urgent concerns.    DIET:  We do recommend a small meal at first, but then you may proceed to your regular diet.  Drink plenty of fluids but  you should avoid alcoholic beverages for 24 hours.  ACTIVITY:  You should plan to take it easy for the rest of today and you should NOT DRIVE or use heavy machinery until tomorrow (because of the sedation medicines used during the test).    FOLLOW UP: Our staff will call the number listed on your records the next business day following your procedure.  We will call around 7:15- 8:00 am to check on you and address any questions or concerns that you may have regarding the information given to you following your procedure. If we do not reach you, we will leave a message.     If any biopsies were taken you will be contacted by phone or by letter within the next 1-3 weeks.  Please call us at [phone] if you have not heard about the biopsies in 3 weeks.    SIGNATURES/CONFIDENTIALITY: You and/or your care partner have signed paperwork which will be entered into your electronic medical record.  These signatures attest to the fact that that the information above on your After Visit Summary has been reviewed and is understood.  Full responsibility of the confidentiality of this discharge information lies with you and/or your care-partner. 

## 2023-06-07 NOTE — Progress Notes (Signed)
Expand All Collapse All    Subjective:      Subjective  Patient ID: Crystal [name], female    DOB: Aug 22, 1956, 67 y.o.   MRN: [id number]   HPI  Crystal [name] is a pleasant 67 year old white female, established with Dr. Marina Goodell who comes back in today with complaints of recurrent episodes of nausea vomiting and upper abdominal pain.  She had recently been seen by her PCP Felix Pacini DO for the symptoms and underwent CT scan of the abdomen and pelvis with contrast on 02/03/2023.  This showed a normal gallbladder no ductal dilation, unchanged 1.1 cm left adrenal nodule and was otherwise unremarkable.  She had labs done last week with normal CBC c-Met lipase and CRP. Here she had undergone colonoscopy in March 2019 with removal of 4 polyps all 2 to 3 mm in size and was noted to have multiple diverticuli in the left colon.  Polyp path showed 3 tubular adenomas and she is indicated for 5-year interval follow-up. She does have prior history of diverticulitis, is status post appendectomy. She says she has had intermittent episodes of vomiting and abdominal pain over the past 3 to 4 years.  She has started tracking these and says that now she is having episodes more frequently.  She had 1 in February and 1 prior to that was on December 4.  She describes abrupt onset of tight bandlike pain around her lower chest upper abdomen that radiates around bilaterally into her back followed by nausea and vomiting with multiple episodes.  She says these episodes exhaust her.  The vomiting may occur repeatedly for about 45 minutes and then the pain may last for a couple of hours and then resolved.  She usually feels bloated and uncomfortable in her upper abdomen as well.  Never has fever chills or sweats.  In between these episodes she feels fine.  She has been unable to determine any specific food triggers.   She has not had regular abdominal ultrasound. No prior EGD   Review of Systems Pertinent positive and negative review of  systems were noted in the above HPI section.  All other review of systems was otherwise negative.        Outpatient Encounter Medications as of 02/08/2023  Medication Sig   Probiotic TBEC 1 tablet Orally once a day   Calcium Carb-Cholecalciferol (CALCIUM/VITAMIN D PO) Take by mouth. 600 mg-800u 3x/week (Patient not taking: Reported on 02/08/2023)   Estrogens, Conjugated (PREMARIN VA) Place vaginally once a week. (Patient not taking: Reported on 02/08/2023)   MegaRed Omega-3 Krill Oil 500 MG CAPS 1 tablet Orally once a day (Patient not taking: Reported on 02/08/2023)   Multiple Vitamin (MULTIVITAMIN ADULT) TABS 1 tablet Orally once a day (Patient not taking: Reported on 02/08/2023)   Quercetin 500 MG CAPS Take by mouth. (Patient not taking: Reported on 02/08/2023)   zinc gluconate 50 MG tablet Take 50 mg by mouth daily. (Patient not taking: Reported on 02/08/2023)    No facility-administered encounter medications on file as of 02/08/2023.         Allergies  Allergen Reactions   Hydromorphone Other (See Comments)      Dilaudid - semi coma when mixed with phenergan   Other Reaction(s): Diaphoresis / Sweating (intolerance), Unknown   Meperidine Other (See Comments)      When mixed with Phenergan, leads to severe somnolence and "blue lips."   When mixed with Phenergan, leads to severe somnolence and "blue lips."  When mixed with  Phenergan, leads to severe somnolence and "blue lips."   Phenergan [Promethazine Hcl] Other (See Comments)      When mixed with Demerol, leads to severe somnolence and "blue lips."   Promethazine Anaphylaxis and Other (See Comments)      Other Reaction(s): semi coma   When mixed with Demerol, leads to severe somnolence and "blue lips."   Pollen Extract        Other Reaction(s): Unknown        Patient Active Problem List    Diagnosis Date Noted   Adrenal nodule (HCC) 02/01/2023   Bilateral high frequency sensorineural hearing loss 02/29/2020   Vomiting 12/07/2012     Social History         Socioeconomic History   Marital status: Married      Spouse name: Not on file   Number of children: 0   Years of education: Not on file   Highest education level: Not on file  Occupational History   Occupation: retired  Tobacco Use   Smoking status: Never      Passive exposure: Never   Smokeless tobacco: Never  Vaping Use   Vaping Use: Never used  Substance and Sexual Activity   Alcohol use: Yes      Comment: 1 -2 bers a day   Drug use: Never   Sexual activity: Yes      Partners: Male      Comment: married  Other Topics Concern   Not on file  Social History Narrative    Marital status/children/pets: Married    Education/employment: 12th grade graduate, retired    Field seismologist:       -smoke alarm in the home:Yes      - wears seatbelt: Yes      - Feels safe in their relationships: Yes         Social Determinants of Manufacturing engineer Strain: Not on file  Food Insecurity: Not on file  Transportation Needs: Not on file  Physical Activity: Not on file  Stress: Not on file  Social Connections: Not on file  Intimate Partner Violence: Not on file      Crystal [name]'s family history includes Alcohol abuse in her brother and father; Breast cancer in her sister; Heart disease in her mother; Heart failure in her mother; Hyperlipidemia in her sister; Hypertension in her brother; Pancreatic cancer in her father.         Objective:    Objective     Vitals:    02/08/23 1024  BP: 126/78  Pulse: 86      Physical Exam Well-developed well-nourished older WF  in no acute distress.  Height, Weight,173 BMI 31.64   HEENT; nontraumatic normocephalic, EOMI, PE R LA, sclera anicteric. Oropharynx;not done Neck; supple, no JVD Cardiovascular; regular rate and rhythm with S1-S2, no murmur rub or gallop Pulmonary; Clear bilaterally Abdomen; soft, nontender, nondistended, no palpable mass or hepatosplenomegaly, bowel sounds are active Rectal;not done   Skin; benign exam, no jaundice rash or appreciable lesions Extremities; no clubbing cyanosis or edema skin warm and dry Neuro/Psych; alert and oriented x4, grossly nonfocal mood and affect appropriate            Assessment & Plan:    #63 67 year old white female with recurrent episodes of lower chest/epigastric abdominal pain described as a tight band around the abdomen radiating bilaterally into the abdomen, severe at onset associated with nausea and vomiting.  Vomiting usually lasting 45 minutes and pain lasting  up to a couple of hours and then resolves.  Patient feels fine in between these episodes.   Very recent CT on 02/02/2022 unremarkable as to any etiology for these episodes. Despite normal reading of gallbladder on CT I am concerned about underlying gallbladder disease.   #2 stable 1.1 cm left adrenal nodule #3.  History of diverticulosis and diverticulitis #4.  Status post appendectomy #5.  History of adenomatous colon polyps-due for follow-up colonoscopy March 2024.   Plan; Will schedule for upper abdominal ultrasound, if ultrasound is unrevealing then CCK HIDA scan.  If CCK HIDA scan negative then will need EGD with Dr. Marina Goodell .  Will hold on scheduling colonoscopy until we complete the gallbladder workup so that if she needs EGD this can be scheduled to be done at the same setting as colonoscopy. Discussed low-fat diet as less likely to trigger any biliary colic type symptoms Further recommendations pending results of above     Amy S Esterwood PA-C 02/08/2023  Recent H&P as above.  The patient did indeed have gallstones and is status post cholecystectomy.  Now for surveillance colonoscopy.

## 2023-06-07 NOTE — Op Note (Signed)
[facility] Patient Name: Crystal [name] Procedure Date: 06/07/2023 10:07 AM MRN: [id number] Endoscopist: Wilhemina Bonito. Marina Goodell , MD, [id number] Age: 68 Referring MD:  Date of Birth: 1955-12-28 Gender: Female Account #: [id number] Procedure:                Colonoscopy with cold snare polypectomy x 1 Indications:              High risk colon cancer surveillance: Personal                            history of multiple (3 or more) adenomas. Previous                            examinations 2008, 2019 Medicines:                Monitored Anesthesia Care Procedure:                Pre-Anesthesia Assessment:                           - Prior to the procedure, a History and Physical                            was performed, and patient medications and                            allergies were reviewed. The patient's tolerance of                            previous anesthesia was also reviewed. The risks                            and benefits of the procedure and the sedation                            options and risks were discussed with the patient.                            All questions were answered, and informed consent                            was obtained. Prior Anticoagulants: The patient has                            taken no anticoagulant or antiplatelet agents. ASA                            Grade Assessment: II - A patient with mild systemic                            disease. After reviewing the risks and benefits,                            the patient was deemed in satisfactory condition to  undergo the procedure.                           After obtaining informed consent, the colonoscope                            was passed under direct vision. Throughout the                            procedure, the patient's blood pressure, pulse, and                            oxygen saturations were monitored continuously. The                            Olympus  Scope SN: [id number] was introduced through                            the anus and advanced to the the cecum, identified                            by appendiceal orifice and ileocecal valve. The                            ileocecal valve, appendiceal orifice, and rectum                            were photographed. The quality of the bowel                            preparation was excellent. The colonoscopy was                            performed without difficulty. The patient tolerated                            the procedure well. The bowel preparation used was                            SUPREP via split dose instruction. Scope In: 10:21:39 AM Scope Out: 10:36:34 AM Scope Withdrawal Time: 0 hours 9 minutes 6 seconds  Total Procedure Duration: 0 hours 14 minutes 55 seconds  Findings:                 A 1 mm polyp was found in the transverse colon. The                            polyp was removed with a cold snare. Resection and                            retrieval were complete.                           Multiple diverticula were found in the left colon  and right colon.                           The exam was otherwise without abnormality on                            direct and retroflexion views. Complications:            No immediate complications. Estimated blood loss:                            None. Estimated Blood Loss:     Estimated blood loss: none. Impression:               - One 1 mm polyp in the transverse colon, removed                            with a cold snare. Resected and retrieved.                           - Diverticulosis in the left colon and in the right                            colon.                           - The examination was otherwise normal on direct                            and retroflexion views. Recommendation:           - Repeat colonoscopy in 5 years for surveillance                            (personal history of  multiple adenomatous polyps).                           - Patient has a contact number available for                            emergencies. The signs and symptoms of potential                            delayed complications were discussed with the                            patient. Return to normal activities tomorrow.                            Written discharge instructions were provided to the                            patient.                           - Resume previous diet.                           -  Continue present medications.                           - Await pathology results. Wilhemina Bonito. Marina Goodell, MD 06/07/2023 10:42:26 AM This report has been signed electronically.

## 2023-06-07 NOTE — Progress Notes (Signed)
Report to PACU, RN, vss, BBS= Clear.  

## 2023-06-07 NOTE — Progress Notes (Signed)
Called to room to assist during endoscopic procedure.  Patient ID and intended procedure confirmed with present staff. Received instructions for my participation in the procedure from the performing physician.  

## 2023-06-07 NOTE — Progress Notes (Signed)
Pt's states no medical or surgical changes since previsit or office visit. 

## 2023-06-08 ENCOUNTER — Telehealth: Payer: Self-pay

## 2023-06-08 NOTE — Telephone Encounter (Signed)
  Follow up Call-     06/07/2023    9:33 AM  Call back number  Post procedure Call Back phone  # [phone]  Permission to leave phone message Yes     Patient questions:  Do you have a fever, pain , or abdominal swelling? No. Pain Score  0 *  Have you tolerated food without any problems? Yes.    Have you been able to return to your normal activities? Yes.    Do you have any questions about your discharge instructions: Diet   No. Medications  No. Follow up visit  No.  Do you have questions or concerns about your Care? No.  Actions: * If pain score is 4 or above: No action needed, pain <4.

## 2023-06-11 ENCOUNTER — Encounter: Payer: Self-pay | Admitting: Internal Medicine

## 2023-07-01 ENCOUNTER — Ambulatory Visit
Admission: RE | Admit: 2023-07-01 | Discharge: 2023-07-01 | Disposition: A | Discharge status: Home / Self Care | Payer: HMO | Source: Ambulatory Visit | Attending: Family Medicine | Admitting: Family Medicine

## 2023-07-01 DIAGNOSIS — Z0000 Encounter for general adult medical examination without abnormal findings: Secondary | ICD-10-CM

## 2023-07-01 DIAGNOSIS — Z1231 Encounter for screening mammogram for malignant neoplasm of breast: Secondary | ICD-10-CM | POA: Diagnosis not present

## 2023-08-19 DIAGNOSIS — E278 Other specified disorders of adrenal gland: Secondary | ICD-10-CM | POA: Diagnosis not present

## 2023-08-19 DIAGNOSIS — D3502 Benign neoplasm of left adrenal gland: Secondary | ICD-10-CM | POA: Diagnosis not present

## 2023-10-20 ENCOUNTER — Encounter: Payer: Self-pay | Admitting: Urgent Care

## 2023-10-20 ENCOUNTER — Ambulatory Visit: Payer: HMO

## 2023-10-20 ENCOUNTER — Ambulatory Visit: Payer: HMO | Admitting: Urgent Care

## 2023-10-20 ENCOUNTER — Other Ambulatory Visit: Payer: Self-pay | Admitting: Urgent Care

## 2023-10-20 VITALS — BP 122/76 | HR 68 | Temp 98.1°F | Wt 175.8 lb

## 2023-10-20 DIAGNOSIS — J22 Unspecified acute lower respiratory infection: Secondary | ICD-10-CM

## 2023-10-20 DIAGNOSIS — R0602 Shortness of breath: Secondary | ICD-10-CM | POA: Diagnosis not present

## 2023-10-20 DIAGNOSIS — R059 Cough, unspecified: Secondary | ICD-10-CM

## 2023-10-20 MED ORDER — MONTELUKAST SODIUM 10 MG PO TABS: 10.0000 mg | ORAL_TABLET | Freq: Every day | ORAL | 0 refills | Status: DC

## 2023-10-20 MED ORDER — PREDNISONE 10 MG (21) PO TBPK: ORAL_TABLET | Freq: Every day | ORAL | 0 refills | Status: DC

## 2023-10-20 NOTE — Telephone Encounter (Signed)
Pt advised of results. 

## 2023-10-20 NOTE — Patient Instructions (Addendum)
Go to [facility] now to obtain a STAT Chest xray.  We will call with the results. If positive for pneumonia, will initiate antibiotic therapy.  If negative, we will start prednisone and montelukast for symptom management.

## 2023-10-20 NOTE — Progress Notes (Signed)
Established Patient Office Visit  Subjective:  Patient ID: Crystal [name], female    DOB: 09/26/1956  Age: 67 y.o. MRN: [id number]  Chief Complaint  Patient presents with   Cough    Pt stated she had a scratchy throat on 11/10 and has had cough and congestion since. Cough is worse at night she has been sleeping in a chair. Sometimes she does cough up a grayish yellow mucous.    Pleasant 67yo female presents today with c/o scratchy throat and cough for the past 10+ days. States on 10/08/23, she was around 100+ people at a fund raiser, then the following day was at a function with 40+ people. Didn't seem as though anyone was visibly sick, but workup on on 10/10/23 with a scratchy throat and cough. She reports a mild headache. States that the cough is worsening, and is productive of a grayish-green mucous. Cough is much worse at night, she has had to sleep sitting up in a recliner for the past week due to inability to lay flat. Pt denies hx of CHF and denies any edema. She took several doses of her OTC cetirizine without sx relief. Reports some pressure in ears, but denies nasal congestion, sinus pain/ pressure, or sore throat. It does continue to feel "scratchy" from the cough per pt. Denies palpitations or chest pain. Feels a tightness in her chest, but denies SOB or DOE. No rash or GI sx. No past hx of pulmonary issues, pt does not smoke. Pt has had 3 documented cases of pneumonia and at least one pleural effusion in the past. Pt states she is UTD on pneumonia, RSV and flu vaccinations.  Cough    Patient Active Problem List   Diagnosis Date Noted   Adrenal nodule (HCC) 02/01/2023   Bilateral high frequency sensorineural hearing loss 02/29/2020   Vomiting 12/07/2012   Past Medical History:  Diagnosis Date   Acute respiratory failure with hypoxia (HCC) 12/07/2012   Allergy    Atypical ductal hyperplasia of breast 01/08/2016   Chicken pox    Colon polyps    Diverticulitis    may  2021   GERD (gastroesophageal reflux disease)    Pleural effusion 12/07/2012   Pneumonia    11/2010;06/2012;11/2012   Social History   Tobacco Use   Smoking status: Never    Passive exposure: Never   Smokeless tobacco: Never  Vaping Use   Vaping status: Never Used  Substance Use Topics   Alcohol use: Yes    Comment: 1 -2 bers a day   Drug use: Never      ROS: as noted in HPI  Objective:     BP 122/76   Pulse 68   Temp 98.1 F (36.7 C) (Oral)   Wt 175 lb 12.8 oz (79.7 kg)   SpO2 97%   BMI 32.15 kg/m  BP Readings from Last 3 Encounters:  10/20/23 122/76  06/07/23 (!) 93/48  03/05/23 116/73   Wt Readings from Last 3 Encounters:  10/20/23 175 lb 12.8 oz (79.7 kg)  06/07/23 164 lb (74.4 kg)  05/17/23 164 lb (74.4 kg)      Physical Exam Vitals and nursing note reviewed.  Constitutional:      General: She is not in acute distress.    Appearance: Normal appearance. She is not ill-appearing, toxic-appearing or diaphoretic.  HENT:     Head: Normocephalic and atraumatic.     Right Ear: Tympanic membrane, ear canal and external ear normal. There is no  impacted cerumen.     Left Ear: Tympanic membrane, ear canal and external ear normal. There is no impacted cerumen.     Nose: Rhinorrhea present. No congestion.     Mouth/Throat:     Mouth: Mucous membranes are moist.     Pharynx: Oropharynx is clear. Posterior oropharyngeal erythema present. No oropharyngeal exudate.  Eyes:     General: No scleral icterus.       Right eye: No discharge.        Left eye: No discharge.     Extraocular Movements: Extraocular movements intact.     Pupils: Pupils are equal, round, and reactive to light.  Cardiovascular:     Rate and Rhythm: Normal rate and regular rhythm.  Pulmonary:     Effort: Pulmonary effort is normal. No respiratory distress.     Breath sounds: Normal breath sounds. No stridor. No wheezing, rhonchi or rales.     Comments: No adventitious breath sounds  auscultated on exam Chest:     Chest wall: No tenderness.  Musculoskeletal:     Cervical back: Normal range of motion and neck supple. No rigidity or tenderness.  Lymphadenopathy:     Cervical: No cervical adenopathy.  Skin:    General: Skin is warm and dry.     Coloration: Skin is not jaundiced.     Findings: No bruising, erythema or rash.  Neurological:     General: No focal deficit present.     Mental Status: She is alert and oriented to person, place, and time.      No results found for any visits on 10/20/23.  Last CBC Lab Results  Component Value Date   WBC 6.7 02/22/2023   HGB 15.1 (H) 02/22/2023   HCT 46.2 (H) 02/22/2023   MCV 90.1 02/22/2023   MCH 29.4 02/22/2023   RDW 13.2 02/22/2023   PLT 331 02/22/2023   Last metabolic panel Lab Results  Component Value Date   GLUCOSE 94 02/22/2023   NA 136 02/22/2023   K 4.4 02/22/2023   CL 101 02/22/2023   CO2 26 02/22/2023   BUN 14 02/22/2023   CREATININE 0.68 02/22/2023   GFRNONAA >60 02/22/2023   CALCIUM 9.7 02/22/2023   PROT 7.4 02/22/2023   ALBUMIN 4.6 02/22/2023   BILITOT 0.8 02/22/2023   ALKPHOS 62 02/22/2023   AST 22 02/22/2023   ALT 20 02/22/2023   ANIONGAP 9 02/22/2023      The ASCVD Risk score (Arnett DK, et al., 2019) failed to calculate for the following reasons:   Cannot find a previous HDL lab   Cannot find a previous total cholesterol lab  Assessment & Plan:  Lower resp. tract infection -     DG Chest 2 View; Future  Given hx of pneumonia and pleural effusions in the past, in addition to persistent sx > 10 days productive of grayish-green sputum, will send pt for CXR to assess for pneumonia. If CXR showing consolidation, will initiate abx. If negative, will do short course of prednisone and sinulair. No inhaler indicated as wheezing not present. Pt amenable to tx plan and workup and will go get CXR now. Will call with results once received.   No follow-ups on file.   Maretta Bees, PA

## 2024-03-22 ENCOUNTER — Ambulatory Visit (INDEPENDENT_AMBULATORY_CARE_PROVIDER_SITE_OTHER): Payer: HMO | Admitting: *Deleted

## 2024-03-22 DIAGNOSIS — Z0000 Encounter for general adult medical examination without abnormal findings: Secondary | ICD-10-CM

## 2024-03-22 NOTE — Progress Notes (Signed)
 Subjective:   Crystal [name] is a 68 y.o. female who presents for Medicare Annual (Subsequent) preventive examination.  Visit Complete: Virtual I connected with  Ever Hiss on 03/22/24 by a audio enabled telemedicine application and verified that I am speaking with the correct person using two identifiers.  Patient Location: Home  Provider Location: Home Office  I discussed the limitations of evaluation and management by telemedicine. The patient expressed understanding and agreed to proceed.  Vital Signs: Because this visit was a virtual/telehealth visit, some criteria may be missing or patient reported. Any vitals not documented were not able to be obtained and vitals that have been documented are patient reported.  Patient Medicare AWV questionnaire was completed by the patient on 03-21-2024; I have confirmed that all information answered by patient is correct and no changes since this date.  Cardiac Risk Factors include: advanced age (>28men, >3 women)     Objective:    There were no vitals filed for this visit. There is no height or weight on file to calculate BMI.     03/22/2024    9:27 AM 03/17/2023    9:26 AM 02/22/2023   11:26 AM 02/25/2018   10:50 AM 02/11/2018    7:58 AM 12/10/2015   11:00 AM 12/05/2015   12:17 PM  Advanced Directives  Does Patient Have a Medical Advance Directive? No Yes No No No No No  Type of Advance Directive  Healthcare Power of Attorney       Does patient want to make changes to medical advance directive?  No - Patient declined       Copy of Healthcare Power of Attorney in Chart?  No - copy requested       Would patient like information on creating a medical advance directive?   No - Patient declined   No - patient declined information     Current Medications (verified) Outpatient Encounter Medications as of 03/22/2024  Medication Sig   Probiotic TBEC Take 1 capsule by mouth daily.   Quercetin 500 MG CAPS Take 500 mg by mouth  daily.   Calcium Carb-Cholecalciferol (CALCIUM 600 + D PO) Take 1 tablet by mouth every other day.   carboxymethylcellulose (REFRESH TEARS) 0.5 % SOLN Place 1 drop into both eyes daily.   cetirizine (ZYRTEC) 10 MG tablet Take 10 mg by mouth daily as needed for allergies. (Patient not taking: Reported on 05/17/2023)   conjugated estrogens (PREMARIN) vaginal cream Place 1 applicator vaginally daily as needed (irrititation / dryness). (Patient not taking: Reported on 10/20/2023)   ibuprofen  (ADVIL ) 200 MG tablet Take 200-400 mg by mouth every 6 (six) hours as needed for moderate pain (pain score 4-6).   MegaRed Omega-3 Krill Oil 350 MG CAPS Take 350 mg by mouth daily. (Patient not taking: Reported on 05/17/2023)   montelukast  (SINGULAIR ) 10 MG tablet Take 1 tablet (10 mg total) by mouth at bedtime.   Multiple Vitamin (MULTIVITAMIN ADULT) TABS Take 1 tablet by mouth daily. (Patient not taking: Reported on 05/17/2023)   predniSONE  (STERAPRED UNI-PAK 21 TAB) 10 MG (21) TBPK tablet Take by mouth daily. Take 6 tabs by mouth daily  for 1 days, then 5 tabs for 1 days, then 4 tabs for 1 days, then 3 tabs for 1 days, 2 tabs for 1 days, then 1 tab by mouth daily for 1 days   zinc gluconate 50 MG tablet Take 50 mg by mouth daily. (Patient not taking: Reported on 06/07/2023)   No  facility-administered encounter medications on file as of 03/22/2024.    Allergies (verified) Hydromorphone , Meperidine, Phenergan [promethazine hcl], Promethazine, and Pollen extract   History: Past Medical History:  Diagnosis Date   Acute respiratory failure with hypoxia (HCC) 12/07/2012   Allergy    Atypical ductal hyperplasia of breast 01/08/2016   Chicken pox    Colon polyps    Diverticulitis    may 2021   GERD (gastroesophageal reflux disease)    Pleural effusion 12/07/2012   Pneumonia    11/2010;06/2012;11/2012   Past Surgical History:  Procedure Laterality Date   ABDOMINAL HYSTERECTOMY     2009   APPENDECTOMY      2016   BREAST BIOPSY Left 09/10/2006   BREAST BIOPSY Left    2016;2017   CHOLECYSTECTOMY N/A 03/05/2023   Procedure: LAPAROSCOPIC CHOLECYSTECTOMY WITH IICG DYE;  Surgeon: Aldean Hummingbird, MD;  Location: [facility];  Service: General;  Laterality: N/A;   COLONOSCOPY     2019   dental implant     RADIOACTIVE SEED GUIDED EXCISIONAL BREAST BIOPSY Left 12/10/2015   Procedure: LEFT BREAST RADIOACTIVE SEED GUIDED EXCISIONAL BIOPSY;  Surgeon: Enid Harry, MD;  Location: [facility];  Service: General;  Laterality: Left;   Family History  Problem Relation Age of Onset   Heart disease Mother    Heart failure Mother    Pancreatic cancer Father    Alcohol abuse Father    Breast cancer Sister    Hyperlipidemia Sister    Hypertension Brother    Alcohol abuse Brother    Colon cancer Neg Hx    Colon polyps Neg Hx    Rectal cancer Neg Hx    Stomach cancer Neg Hx    Esophageal cancer Neg Hx    Social History   Socioeconomic History   Marital status: Married    Spouse name: Not on file   Number of children: 0   Years of education: Not on file   Highest education level: Not on file  Occupational History   Occupation: retired  Tobacco Use   Smoking status: Never    Passive exposure: Never   Smokeless tobacco: Never  Vaping Use   Vaping status: Never Used  Substance and Sexual Activity   Alcohol use: Yes    Comment: 1 -2 bers a day   Drug use: Never   Sexual activity: Yes    Partners: Male    Comment: married  Other Topics Concern   Not on file  Social History Narrative   Marital status/children/pets: Married   Education/employment: 12th grade graduate, retired   Field seismologist:      -smoke alarm in the home:Yes     - wears seatbelt: Yes     - Feels safe in their relationships: Yes      Social Drivers of Corporate investment banker Strain: Low Risk  (03/22/2024)   Overall Financial Resource Strain (CARDIA)    Difficulty of Paying Living Expenses: Not hard at all  Food  Insecurity: No Food Insecurity (03/22/2024)   Hunger Vital Sign    Worried About Running Out of Food in the Last Year: Never true    Ran Out of Food in the Last Year: Never true  Transportation Needs: No Transportation Needs (03/22/2024)   PRAPARE - Administrator, Civil Service (Medical): No    Lack of Transportation (Non-Medical): No  Physical Activity: Sufficiently Active (03/22/2024)   Exercise Vital Sign    Days of Exercise per Week:  7 days    Minutes of Exercise per Session: 30 min  Stress: No Stress Concern Present (03/22/2024)   [employer] of Occupational Health - Occupational Stress Questionnaire    Feeling of Stress : Not at all  Social Connections: Moderately Isolated (03/22/2024)   Social Connection and Isolation Panel [NHANES]    Frequency of Communication with Friends and Family: More than three times a week    Frequency of Social Gatherings with Friends and Family: Three times a week    Attends Religious Services: Never    Active Member of Clubs or Organizations: No    Attends Banker Meetings: Never    Marital Status: Married    Tobacco Counseling Counseling given: Not Answered   Clinical Intake:  Pre-visit preparation completed: Yes  Pain : No/denies pain     Diabetes: No  How often do you need to have someone help you when you read instructions, pamphlets, or other written materials from your doctor or pharmacy?: 1 - Never  Interpreter Needed?: No  Information entered by :: Kieth Pelt LPN   Activities of Daily Living    03/22/2024    9:27 AM 03/21/2024    6:39 PM  In your present state of health, do you have any difficulty performing the following activities:  Hearing? 0 0  Vision? 0 0  Difficulty concentrating or making decisions? 0 0  Walking or climbing stairs? 0 0  Dressing or bathing? 0 0  Doing errands, shopping? 0 0  Preparing Food and eating ? N N  Using the Toilet? N N  In the past six months, have you  accidently leaked urine? N N  Do you have problems with loss of bowel control? N N  Managing your Medications? N N  Managing your Finances? N N  Housekeeping or managing your Housekeeping? N N    Patient Care Team: Mariel Shope, DO as PCP - General (Family Medicine) [facility], [facility] (Ophthalmology) Rachelle Bue, MD as Referring Physician (General Surgery) Katherin Pam, MD as Referring Physician (Specialist) Legrand Puma as Consulting Physician (Gastroenterology)  Indicate any recent Medical Services you may have received from other than [facility] providers in the past year (date may be approximate).     Assessment:   This is a routine wellness examination for Xandra.  Hearing/Vision screen Hearing Screening - Comments:: No trouble hearing Vision Screening - Comments:: Up to date [facility]   Goals Addressed             This Visit's Progress    Patient Stated       Loose some weight       Depression Screen    03/22/2024    9:29 AM 03/17/2023    9:25 AM 02/01/2023   10:09 AM  PHQ 2/9 Scores  PHQ - 2 Score 0 0 0  PHQ- 9 Score 0      Fall Risk    03/22/2024    9:27 AM 03/21/2024    6:39 PM 03/17/2023    7:31 AM 02/01/2023   10:09 AM  Fall Risk   Falls in the past year? 0 0 0 0  Number falls in past yr: 0  0 0  Injury with Fall? 0  0 0  Risk for fall due to :   No Fall Risks   Follow up Falls evaluation completed;Education provided;Falls prevention discussed  Falls evaluation completed Falls evaluation completed    MEDICARE RISK AT  HOME: Medicare Risk at Home Any stairs in or around the home?: No Home free of loose throw rugs in walkways, pet beds, electrical cords, etc?: Yes Adequate lighting in your home to reduce risk of falls?: Yes Life alert?: No Use of a cane, walker or w/c?: No Grab bars in the bathroom?: No Shower chair or bench in shower?: No Elevated toilet seat or a handicapped toilet?: Yes  TIMED UP AND GO:  Was  the test performed?  No    Cognitive Function:        03/22/2024    9:29 AM 03/17/2023    9:27 AM  6CIT Screen  What Year? 0 points 0 points  What month? 0 points 0 points  What time? 0 points 0 points  Count back from 20 0 points 0 points  Months in reverse 0 points 0 points  Repeat phrase 0 points   Total Score 0 points     Immunizations Immunization History  Administered Date(s) Administered   DTaP 12/15/2012   Fluad Quad(high Dose 65+) 08/30/2022   Influenza Inj Mdck Quad Pf 09/02/2020   Influenza Split 08/29/2012   Influenza,inj,Quad PF,6+ Mos 09/25/2021, 09/17/2022   Pneumococcal Conjugate-13 11/28/2020   Pneumococcal Polysaccharide-23 08/25/2012   Respiratory Syncytial Virus Vaccine,Recomb Aduvanted(Arexvy) 11/13/2022   Tdap 10/30/2014   Zoster Recombinant(Shingrix) 09/25/2021, 11/25/2022    TDAP status: Up to date  Flu Vaccine status: Up to date  Pneumococcal vaccine status: Up to date  Covid-19 vaccine status: Information provided on how to obtain vaccines.   Qualifies for Shingles Vaccine? No   Zostavax completed Yes   Shingrix Completed?: Yes  Screening Tests Health Maintenance  Topic Date Due   Hepatitis C Screening  Never done   INFLUENZA VACCINE  06/30/2024   DTaP/Tdap/Td (3 - Td or Tdap) 10/30/2024   Medicare Annual Wellness (AWV)  03/22/2025   MAMMOGRAM  06/30/2025   Pneumonia Vaccine 87+ Years old (3 of 3 - PCV20 or PCV21) 11/28/2025   Colonoscopy  06/06/2028   DEXA SCAN  Completed   Zoster Vaccines- Shingrix  Completed   HPV VACCINES  Aged Out   Meningococcal B Vaccine  Aged Out   COVID-19 Vaccine  Discontinued    Health Maintenance  Health Maintenance Due  Topic Date Due   Hepatitis C Screening  Never done    Colorectal cancer screening: Type of screening: Colonoscopy. Completed 2024. Repeat every 5 years  Mammogram status: Completed  . Repeat every year  Bone Density status: Completed 2019. Results reflect: Bone density  results: NORMAL. Repeat every 0 years.  Lung Cancer Screening: (Low Dose CT Chest recommended if Age 49-80 years, 20 pack-year currently smoking OR have quit w/in 15years.) does not qualify.   Lung Cancer Screening Referral:   Additional Screening:  Hepatitis C Screening: does qualify never done  Vision Screening: Recommended annual ophthalmology exams for early detection of glaucoma and other disorders of the eye. Is the patient up to date with their annual eye exam?  Yes  Who is the provider or what is the name of the office in which the patient attends annual eye exams? [facility] in [place] If pt is not established with a provider, would they like to be referred to a provider to establish care? No .   Dental Screening: Recommended annual dental exams for proper oral hygiene    Community Resource Referral / Chronic Care Management: CRR required this visit?  No   CCM required this visit?  No  Plan:     I have personally reviewed and noted the following in the patient's chart:   Medical and social history Use of alcohol, tobacco or illicit drugs  Current medications and supplements including opioid prescriptions. Patient is not currently taking opioid prescriptions. Functional ability and status Nutritional status Physical activity Advanced directives List of other physicians Hospitalizations, surgeries, and ER visits in previous 12 months Vitals Screenings to include cognitive, depression, and falls Referrals and appointments  In addition, I have reviewed and discussed with patient certain preventive protocols, quality metrics, and best practice recommendations. A written personalized care plan for preventive services as well as general preventive health recommendations were provided to patient.     Kieth Pelt, LPN   03/13/2439   After Visit Summary: (MyChart) Due to this being a telephonic visit, the after visit summary with patients personalized  plan was offered to patient via MyChart   Nurse Notes:

## 2024-03-22 NOTE — Patient Instructions (Signed)
 [name] , Thank you for taking time to come for your Medicare Wellness Visit. I appreciate your ongoing commitment to your health goals. Please review the following plan we discussed and let me know if I can assist you in the future.   Screening recommendations/referrals: Colonoscopy: up to date Mammogram: up to date Bone Density: up to date Recommended yearly ophthalmology/optometry visit for glaucoma screening and checkup Recommended yearly dental visit for hygiene and checkup  Vaccinations: Influenza vaccine: up to date Pneumococcal vaccine: up to date Tdap vaccine: up to date Shingles vaccine: up to date      Preventive Care 65 Years and Older, Female Preventive care refers to lifestyle choices and visits with your health care provider that can promote health and wellness. What does preventive care include? A yearly physical exam. This is also called an annual well check. Dental exams once or twice a year. Routine eye exams. Ask your health care provider how often you should have your eyes checked. Personal lifestyle choices, including: Daily care of your teeth and gums. Regular physical activity. Eating a healthy diet. Avoiding tobacco and drug use. Limiting alcohol use. Practicing safe sex. Taking low-dose aspirin every day. Taking vitamin and mineral supplements as recommended by your health care provider. What happens during an annual well check? The services and screenings done by your health care provider during your annual well check will depend on your age, overall health, lifestyle risk factors, and family history of disease. Counseling  Your health care provider may ask you questions about your: Alcohol use. Tobacco use. Drug use. Emotional well-being. Home and relationship well-being. Sexual activity. Eating habits. History of falls. Memory and ability to understand (cognition). Work and work Astronomer. Reproductive health. Screening  You may have  the following tests or measurements: Height, weight, and BMI. Blood pressure. Lipid and cholesterol levels. These may be checked every 5 years, or more frequently if you are over 51 years old. Skin check. Lung cancer screening. You may have this screening every year starting at age 51 if you have a 30-pack-year history of smoking and currently smoke or have quit within the past 15 years. Fecal occult blood test (FOBT) of the stool. You may have this test every year starting at age 72. Flexible sigmoidoscopy or colonoscopy. You may have a sigmoidoscopy every 5 years or a colonoscopy every 10 years starting at age 16. Hepatitis C blood test. Hepatitis B blood test. Sexually transmitted disease (STD) testing. Diabetes screening. This is done by checking your blood sugar (glucose) after you have not eaten for a while (fasting). You may have this done every 1-3 years. Bone density scan. This is done to screen for osteoporosis. You may have this done starting at age 46. Mammogram. This may be done every 1-2 years. Talk to your health care provider about how often you should have regular mammograms. Talk with your health care provider about your test results, treatment options, and if necessary, the need for more tests. Vaccines  Your health care provider may recommend certain vaccines, such as: Influenza vaccine. This is recommended every year. Tetanus, diphtheria, and acellular pertussis (Tdap, Td) vaccine. You may need a Td booster every 10 years. Zoster vaccine. You may need this after age 19. Pneumococcal 13-valent conjugate (PCV13) vaccine. One dose is recommended after age 45. Pneumococcal polysaccharide (PPSV23) vaccine. One dose is recommended after age 13. Talk to your health care provider about which screenings and vaccines you need and how often you need them. This  information is not intended to replace advice given to you by your health care provider. Make sure you discuss any questions  you have with your health care provider. Document Released: 12/13/2015 Document Revised: 08/05/2016 Document Reviewed: 09/17/2015 Elsevier Interactive Patient Education  2017 [employer].  Fall Prevention in the Home Falls can cause injuries. They can happen to people of all ages. There are many things you can do to make your home safe and to help prevent falls. What can I do on the outside of my home? Regularly fix the edges of walkways and driveways and fix any cracks. Remove anything that might make you trip as you walk through a door, such as a raised step or threshold. Trim any bushes or trees on the path to your home. Use bright outdoor lighting. Clear any walking paths of anything that might make someone trip, such as rocks or tools. Regularly check to see if handrails are loose or broken. Make sure that both sides of any steps have handrails. Any raised decks and porches should have guardrails on the edges. Have any leaves, snow, or ice cleared regularly. Use sand or salt on walking paths during winter. Clean up any spills in your garage right away. This includes oil or grease spills. What can I do in the bathroom? Use night lights. Install grab bars by the toilet and in the tub and shower. Do not use towel bars as grab bars. Use non-skid mats or decals in the tub or shower. If you need to sit down in the shower, use a plastic, non-slip stool. Keep the floor dry. Clean up any water that spills on the floor as soon as it happens. Remove soap buildup in the tub or shower regularly. Attach bath mats securely with double-sided non-slip rug tape. Do not have throw rugs and other things on the floor that can make you trip. What can I do in the bedroom? Use night lights. Make sure that you have a light by your bed that is easy to reach. Do not use any sheets or blankets that are too big for your bed. They should not hang down onto the floor. Have a firm chair that has side arms. You  can use this for support while you get dressed. Do not have throw rugs and other things on the floor that can make you trip. What can I do in the kitchen? Clean up any spills right away. Avoid walking on wet floors. Keep items that you use a lot in easy-to-reach places. If you need to reach something above you, use a strong step stool that has a grab bar. Keep electrical cords out of the way. Do not use floor polish or wax that makes floors slippery. If you must use wax, use non-skid floor wax. Do not have throw rugs and other things on the floor that can make you trip. What can I do with my stairs? Do not leave any items on the stairs. Make sure that there are handrails on both sides of the stairs and use them. Fix handrails that are broken or loose. Make sure that handrails are as long as the stairways. Check any carpeting to make sure that it is firmly attached to the stairs. Fix any carpet that is loose or worn. Avoid having throw rugs at the top or bottom of the stairs. If you do have throw rugs, attach them to the floor with carpet tape. Make sure that you have a light switch at the top  of the stairs and the bottom of the stairs. If you do not have them, ask someone to add them for you. What else can I do to help prevent falls? Wear shoes that: Do not have high heels. Have rubber bottoms. Are comfortable and fit you well. Are closed at the toe. Do not wear sandals. If you use a stepladder: Make sure that it is fully opened. Do not climb a closed stepladder. Make sure that both sides of the stepladder are locked into place. Ask someone to hold it for you, if possible. Clearly mark and make sure that you can see: Any grab bars or handrails. First and last steps. Where the edge of each step is. Use tools that help you move around (mobility aids) if they are needed. These include: Canes. Walkers. Scooters. Crutches. Turn on the lights when you go into a dark area. Replace any  light bulbs as soon as they burn out. Set up your furniture so you have a clear path. Avoid moving your furniture around. If any of your floors are uneven, fix them. If there are any pets around you, be aware of where they are. Review your medicines with your doctor. Some medicines can make you feel dizzy. This can increase your chance of falling. Ask your doctor what other things that you can do to help prevent falls. This information is not intended to replace advice given to you by your health care provider. Make sure you discuss any questions you have with your health care provider. Document Released: 09/12/2009 Document Revised: 04/23/2016 Document Reviewed: 12/21/2014 Elsevier Interactive Patient Education  2017 [employer].

## 2024-03-23 DIAGNOSIS — L821 Other seborrheic keratosis: Secondary | ICD-10-CM | POA: Diagnosis not present

## 2024-03-23 DIAGNOSIS — D225 Melanocytic nevi of trunk: Secondary | ICD-10-CM | POA: Diagnosis not present

## 2024-03-23 DIAGNOSIS — L814 Other melanin hyperpigmentation: Secondary | ICD-10-CM | POA: Diagnosis not present

## 2024-03-23 DIAGNOSIS — D492 Neoplasm of unspecified behavior of bone, soft tissue, and skin: Secondary | ICD-10-CM | POA: Diagnosis not present

## 2024-03-27 ENCOUNTER — Encounter: Admitting: Family Medicine

## 2024-03-27 ENCOUNTER — Ambulatory Visit (INDEPENDENT_AMBULATORY_CARE_PROVIDER_SITE_OTHER): Admitting: Family Medicine

## 2024-03-27 ENCOUNTER — Encounter: Payer: Self-pay | Admitting: Family Medicine

## 2024-03-27 VITALS — BP 122/72 | HR 63 | Temp 98.2°F | Ht 62.0 in | Wt 176.8 lb

## 2024-03-27 DIAGNOSIS — Z0000 Encounter for general adult medical examination without abnormal findings: Secondary | ICD-10-CM

## 2024-03-27 DIAGNOSIS — Z131 Encounter for screening for diabetes mellitus: Secondary | ICD-10-CM | POA: Diagnosis not present

## 2024-03-27 DIAGNOSIS — E279 Disorder of adrenal gland, unspecified: Secondary | ICD-10-CM

## 2024-03-27 DIAGNOSIS — Z23 Encounter for immunization: Secondary | ICD-10-CM

## 2024-03-27 DIAGNOSIS — Z1159 Encounter for screening for other viral diseases: Secondary | ICD-10-CM | POA: Diagnosis not present

## 2024-03-27 DIAGNOSIS — Z13220 Encounter for screening for lipoid disorders: Secondary | ICD-10-CM | POA: Diagnosis not present

## 2024-03-27 DIAGNOSIS — M8589 Other specified disorders of bone density and structure, multiple sites: Secondary | ICD-10-CM | POA: Diagnosis not present

## 2024-03-27 DIAGNOSIS — E669 Obesity, unspecified: Secondary | ICD-10-CM | POA: Diagnosis not present

## 2024-03-27 DIAGNOSIS — Z1231 Encounter for screening mammogram for malignant neoplasm of breast: Secondary | ICD-10-CM

## 2024-03-27 LAB — COMPREHENSIVE METABOLIC PANEL WITH GFR
ALT: 15 U/L (ref 0–35)
AST: 19 U/L (ref 0–37)
Albumin: 4.3 g/dL (ref 3.5–5.2)
Alkaline Phosphatase: 52 U/L (ref 39–117)
BUN: 11 mg/dL (ref 6–23)
CO2: 28 meq/L (ref 19–32)
Calcium: 9.5 mg/dL (ref 8.4–10.5)
Chloride: 101 meq/L (ref 96–112)
Creatinine, Ser: 0.65 mg/dL (ref 0.40–1.20)
GFR: 89.46 mL/min
Glucose, Bld: 91 mg/dL (ref 70–99)
Potassium: 4.6 meq/L (ref 3.5–5.1)
Sodium: 138 meq/L (ref 135–145)
Total Bilirubin: 1 mg/dL (ref 0.2–1.2)
Total Protein: 6.4 g/dL (ref 6.0–8.3)

## 2024-03-27 LAB — HEMOGLOBIN A1C: Hgb A1c MFr Bld: 5.5 % (ref 4.6–6.5)

## 2024-03-27 LAB — CBC
HCT: 42.2 % (ref 36.0–46.0)
Hemoglobin: 13.9 g/dL (ref 12.0–15.0)
MCHC: 32.9 g/dL (ref 30.0–36.0)
MCV: 90.2 fl (ref 78.0–100.0)
Platelets: 324 10*3/uL (ref 150.0–400.0)
RBC: 4.68 Mil/uL (ref 3.87–5.11)
RDW: 14.5 % (ref 11.5–15.5)
WBC: 4.9 10*3/uL (ref 4.0–10.5)

## 2024-03-27 LAB — VITAMIN D 25 HYDROXY (VIT D DEFICIENCY, FRACTURES): VITD: 19.09 ng/mL — ABNORMAL LOW (ref 30.00–100.00)

## 2024-03-27 LAB — LIPID PANEL
Cholesterol: 296 mg/dL — ABNORMAL HIGH (ref 0–200)
HDL: 130 mg/dL
LDL Cholesterol: 148 mg/dL — ABNORMAL HIGH (ref 0–99)
NonHDL: 165.71
Total CHOL/HDL Ratio: 2
Triglycerides: 90 mg/dL (ref 0.0–149.0)
VLDL: 18 mg/dL (ref 0.0–40.0)

## 2024-03-27 LAB — TSH: TSH: 3.05 u[IU]/mL (ref 0.35–5.50)

## 2024-03-27 MED ORDER — TETANUS-DIPHTH-ACELL PERTUSSIS 5-2.5-18.5 LF-MCG/0.5 IM SUSP: 0.5000 mL | Freq: Once | INTRAMUSCULAR | 0 refills | Status: AC

## 2024-03-27 NOTE — Patient Instructions (Addendum)

## 2024-03-27 NOTE — Progress Notes (Signed)
 Patient ID: Crystal [name], female  DOB: 1956-09-15, 68 y.o.   MRN: [id number] Patient Care Team    Relationship Specialty Notifications Start End  Mariel Shope, DO PCP - General Family Medicine  02/01/23   Pa, [facility]  Ophthalmology  01/28/23   Rachelle Bue, MD Referring Physician General Surgery  01/28/23   Katherin Pam, MD Referring Physician Specialist  01/28/23    Comment: dermatology  Legrand Puma Consulting Physician Gastroenterology  02/01/23     Chief Complaint  Patient presents with   Annual Exam    Pt is fasting.     Subjective:  Crystal [name] is a 68 y.o.  Female  present for CPE. All past medical history, surgical history, allergies, family history, immunizations, medications and social history were updated in the electronic medical record today. All recent labs, ED visits and hospitalizations within the last year were reviewed.  Health maintenance:  Colonoscopy: completed 06/07/2023, by Dr. Elvin Hammer, resutls x 1 polyp. follow up 5 years. Mammogram: completed: Completed 07/01/2023, breast center-[facility]-ordered 2025 Immunizations: tdap UTD 10/30/2014-reminded patient this is due this year and to obtain pharmacy, Influenza UTD 2024 (encouraged yearly), PNA 20 due completed, Shingrix series completed Infectious disease screening: Hep C collected today DEXA: last completed 04/2018, T-score -1.6-osteopenia-breast center, last ordered by Dr. Wayna Hails Patient has a Dental home. Hospitalizations/ED visits: Reviewed       03/27/2024    8:37 AM 03/22/2024    9:29 AM 03/17/2023    9:25 AM 02/01/2023   10:09 AM  Depression screen PHQ 2/9  Decreased Interest 0 0 0 0  Down, Depressed, Hopeless 0 0 0 0  PHQ - 2 Score 0 0 0 0  Altered sleeping 0 0    Tired, decreased energy 0 0    Change in appetite 0 0    Feeling bad or failure about yourself  0 0    Trouble concentrating 0 0    Moving slowly or fidgety/restless 0 0    Suicidal thoughts 0 0    PHQ-9  Score 0 0    Difficult doing work/chores Not difficult at all Not difficult at all        03/27/2024    8:37 AM  GAD 7 : Generalized Anxiety Score  Nervous, Anxious, on Edge 0  Control/stop worrying 0  Worry too much - different things 0  Trouble relaxing 0  Restless 0  Easily annoyed or irritable 0  Afraid - awful might happen 0  Total GAD 7 Score 0  Anxiety Difficulty Not difficult at all    Immunization History  Administered Date(s) Administered   DTaP 12/15/2012   Fluad Quad(high Dose 65+) 08/30/2022   Influenza Inj Mdck Quad Pf 09/02/2020   Influenza Split 08/29/2012   Influenza,inj,Quad PF,6+ Mos 09/25/2021, 09/17/2022   Influenza-Unspecified 09/06/2023   PNEUMOCOCCAL CONJUGATE-20 03/27/2024   Pneumococcal Conjugate-13 11/28/2020   Pneumococcal Polysaccharide-23 08/25/2012   Respiratory Syncytial Virus Vaccine,Recomb Aduvanted(Arexvy) 11/13/2022   Tdap 10/30/2014   Zoster Recombinant(Shingrix) 09/25/2021, 11/25/2022   Past Medical History:  Diagnosis Date   Acute respiratory failure with hypoxia (HCC) 12/07/2012   Allergy    Atypical ductal hyperplasia of breast 01/08/2016   Chicken pox    Colon polyps    Diverticulitis    may 2021   GERD (gastroesophageal reflux disease)    Pleural effusion 12/07/2012   Pneumonia    11/2010;06/2012;11/2012   Allergies  Allergen Reactions   Hydromorphone  Other (See Comments)  Dilaudid  - semi coma when mixed with phenergan  Other Reaction(s): Diaphoresis / Sweating (intolerance), Unknown   Meperidine Other (See Comments)    When mixed with Phenergan, leads to severe somnolence and "blue lips."  When mixed with Phenergan, leads to severe somnolence and "blue lips."  When mixed with Phenergan, leads to severe somnolence and "blue lips."   Phenergan [Promethazine Hcl] Other (See Comments)    When mixed with Demerol, leads to severe somnolence and "blue lips."   Promethazine Anaphylaxis and Other (See Comments)    Other  Reaction(s): semi coma  When mixed with Demerol, leads to severe somnolence and "blue lips."   Pollen Extract     Other Reaction(s): Unknown   Past Surgical History:  Procedure Laterality Date   ABDOMINAL HYSTERECTOMY     2009   APPENDECTOMY     2016   BREAST BIOPSY Left 09/10/2006   BREAST BIOPSY Left    2016;2017   CHOLECYSTECTOMY N/A 03/05/2023   Procedure: LAPAROSCOPIC CHOLECYSTECTOMY WITH IICG DYE;  Surgeon: Aldean Hummingbird, MD;  Location: [facility];  Service: General;  Laterality: N/A;   COLONOSCOPY     2019   dental implant     RADIOACTIVE SEED GUIDED EXCISIONAL BREAST BIOPSY Left 12/10/2015   Procedure: LEFT BREAST RADIOACTIVE SEED GUIDED EXCISIONAL BIOPSY;  Surgeon: Enid Harry, MD;  Location: [facility];  Service: General;  Laterality: Left;   Family History  Problem Relation Age of Onset   Heart disease Mother    Heart failure Mother    Pancreatic cancer Father    Alcohol abuse Father    Breast cancer Sister    Hyperlipidemia Sister    Hypertension Brother    Alcohol abuse Brother    Colon cancer Neg Hx    Colon polyps Neg Hx    Rectal cancer Neg Hx    Stomach cancer Neg Hx    Esophageal cancer Neg Hx    Social History   Social History Narrative   Marital status/children/pets: Married   Education/employment: 12th grade graduate, retired   Field seismologist:      -smoke alarm in the home:Yes     - wears seatbelt: Yes     - Feels safe in their relationships: Yes       Allergies as of 03/27/2024       Reactions   Hydromorphone  Other (See Comments)   Dilaudid  - semi coma when mixed with phenergan Other Reaction(s): Diaphoresis / Sweating (intolerance), Unknown   Meperidine Other (See Comments)   When mixed with Phenergan, leads to severe somnolence and "blue lips." When mixed with Phenergan, leads to severe somnolence and "blue lips."  When mixed with Phenergan, leads to severe somnolence and "blue lips."   Phenergan [promethazine Hcl] Other (See  Comments)   When mixed with Demerol, leads to severe somnolence and "blue lips."   Promethazine Anaphylaxis, Other (See Comments)   Other Reaction(s): semi coma When mixed with Demerol, leads to severe somnolence and "blue lips."   Pollen Extract    Other Reaction(s): Unknown        Medication List        Accurate as of March 27, 2024  9:03 AM. If you have any questions, ask your nurse or doctor.          STOP taking these medications    cetirizine 10 MG tablet Commonly known as: ZYRTEC Stopped by: Reshonda Koerber   conjugated estrogens vaginal cream Commonly known as: PREMARIN Stopped by: Sky Borboa  ibuprofen  200 MG tablet Commonly known as: ADVIL  Stopped by: Temitope Flammer   MegaRed Omega-3 Krill Oil 350 MG Caps Stopped by: Napolean Backbone   montelukast  10 MG tablet Commonly known as: SINGULAIR  Stopped by: Napolean Backbone   Multivitamin Adult Tabs Stopped by: Napolean Backbone   predniSONE  10 MG (21) Tbpk tablet Commonly known as: STERAPRED UNI-PAK 21 TAB Stopped by: Napolean Backbone   zinc gluconate 50 MG tablet Stopped by: Napolean Backbone       TAKE these medications    CALCIUM 600 + D PO Take 1 tablet by mouth every other day.   moxifloxacin 0.5 % ophthalmic solution Commonly known as: VIGAMOX Place 1 drop into the left eye 4 (four) times daily.   Probiotic Tbec Take 1 capsule by mouth daily.   Quercetin 500 MG Caps Take 500 mg by mouth daily.   Refresh Tears 0.5 % Soln Generic drug: carboxymethylcellulose Place 1 drop into both eyes daily.   Tdap 5-2.5-18.5 LF-MCG/0.5 injection Commonly known as: BOOSTRIX Inject 0.5 mLs into the muscle once for 1 dose. Started by: Napolean Backbone        All past medical history, surgical history, allergies, family history, immunizations andmedications were updated in the EMR today and reviewed under the history and medication portions of their EMR.     No results found for this or any previous visit (from the past  2160 hours).  MM 3D SCREENING MAMMOGRAM BILATERAL BREAST Result Date: 07/02/2023 CLINICAL DATA:  Screening. EXAM: DIGITAL SCREENING BILATERAL MAMMOGRAM WITH TOMOSYNTHESIS AND CAD TECHNIQUE: Bilateral screening digital craniocaudal and mediolateral oblique mammograms were obtained. Bilateral screening digital breast tomosynthesis was performed. The images were evaluated with computer-aided detection. COMPARISON:  Previous exam(s). ACR Breast Density Category c: The breasts are heterogeneously dense, which may obscure small masses. FINDINGS: There are no findings suspicious for malignancy. IMPRESSION: No mammographic evidence of malignancy. A result letter of this screening mammogram will be mailed directly to the patient. RECOMMENDATION: Screening mammogram in one year. (Code:SM-B-01Y) BI-RADS CATEGORY  1: Negative. Electronically Signed   By: Amanda Jungling M.D.   On: 07/02/2023 10:12     ROS 14 pt review of systems performed and negative (unless mentioned in an HPI)  Objective: BP 122/72   Pulse 63   Temp 98.2 F (36.8 C)   Ht 5\' 2"  (1.575 m)   Wt 176 lb 12.8 oz (80.2 kg)   SpO2 97%   BMI 32.34 kg/m  Physical Exam Vitals and nursing note reviewed.  Constitutional:      General: She is not in acute distress.    Appearance: Normal appearance. She is not ill-appearing or toxic-appearing.  HENT:     Head: Normocephalic and atraumatic.     Right Ear: Tympanic membrane, ear canal and external ear normal. There is no impacted cerumen.     Left Ear: Tympanic membrane, ear canal and external ear normal. There is no impacted cerumen.     Nose: No congestion or rhinorrhea.     Mouth/Throat:     Mouth: Mucous membranes are moist.     Pharynx: Oropharynx is clear. No oropharyngeal exudate or posterior oropharyngeal erythema.  Eyes:     General: No scleral icterus.       Right eye: No discharge.        Left eye: No discharge.     Extraocular Movements: Extraocular movements intact.      Conjunctiva/sclera: Conjunctivae normal.     Pupils: Pupils are equal, round, and reactive  to light.  Cardiovascular:     Rate and Rhythm: Normal rate and regular rhythm.     Pulses: Normal pulses.     Heart sounds: Normal heart sounds. No murmur heard.    No friction rub. No gallop.  Pulmonary:     Effort: Pulmonary effort is normal. No respiratory distress.     Breath sounds: Normal breath sounds. No stridor. No wheezing, rhonchi or rales.  Chest:     Chest wall: No tenderness.  Abdominal:     General: Abdomen is flat. Bowel sounds are normal. There is no distension.     Palpations: Abdomen is soft. There is no mass.     Tenderness: There is no abdominal tenderness. There is no right CVA tenderness, left CVA tenderness, guarding or rebound.     Hernia: No hernia is present.  Musculoskeletal:        General: No swelling, tenderness or deformity. Normal range of motion.     Cervical back: Normal range of motion and neck supple. No rigidity or tenderness.     Right lower leg: No edema.     Left lower leg: No edema.  Lymphadenopathy:     Cervical: No cervical adenopathy.  Skin:    General: Skin is warm and dry.     Coloration: Skin is not jaundiced or pale.     Findings: No bruising, erythema, lesion or rash.  Neurological:     General: No focal deficit present.     Mental Status: She is alert and oriented to person, place, and time. Mental status is at baseline.     Cranial Nerves: No cranial nerve deficit.     Sensory: No sensory deficit.     Motor: No weakness.     Coordination: Coordination normal.     Gait: Gait normal.     Deep Tendon Reflexes: Reflexes normal.  Psychiatric:        Mood and Affect: Mood normal.        Behavior: Behavior normal.        Thought Content: Thought content normal.        Judgment: Judgment normal.      No results found.  Assessment/plan: Rikayla Groah is a 68 y.o. female present for CPE  Adrenal nodule (HCC) Encouraged her to  clarify with speciality team if she needs to continue to have her adrenal nodule followed. Uncertain by last note.   Breast cancer screening by mammogram Ordered 2025  Osteopenia of multiple sites-T-score -1.6 - Vitamin D (25hydroxy) Encounter for hepatitis C screening test for low risk patient - Hepatitis C Antibody Need for vaccination for pneumococcus PNA20 completed Lipid screening - Lipid panel Diabetes mellitus screening - Hemoglobin A1c Need for Tdap vaccination - Tdap (BOOSTRIX) 5-2.5-18.5 LF-MCG/0.5 injection; Inject 0.5 mLs into the muscle once for 1 dose.  Dispense: 0.5 mL; Refill: 0 E66.9 (BMI 30-39.9) - Hemoglobin A1c - Lipid panel  Routine general medical examination at a health care facility (Primary) - CBC - Comprehensive metabolic panel with GFR - TSH Patient was encouraged to exercise greater than 150 minutes a week. Patient was encouraged to choose a diet filled with fresh fruits and vegetables, and lean meats. AVS provided to patient today for education/recommendation on gender specific health and safety maintenance. Colonoscopy: completed 06/07/2023, by Dr. Elvin Hammer, resutls x 1 polyp. follow up 5 years. Mammogram: completed: Completed 07/01/2023, breast center-[facility]-ordered 2025 Immunizations: tdap UTD 10/30/2014-reminded patient this is due this year and to obtain pharmacy, Influenza UTD  2024 (encouraged yearly), PNA 20 completed, Shingrix series completed Infectious disease screening: Hep C collected today DEXA: last completed 04/2018, T-score -1.6-osteopenia-breast center, last ordered by Dr. Wayna Hails  Return in about 1 year (around 03/28/2025) for cpe (20 min).    Orders Placed This Encounter  Procedures   MM 3D SCREENING MAMMOGRAM BILATERAL BREAST   DG Bone Density   Pneumococcal conjugate vaccine 20-valent   CBC   Comprehensive metabolic panel with GFR   Hemoglobin A1c   Lipid panel   TSH   Hepatitis C Antibody   Vitamin D (25 hydroxy)   Meds  ordered this encounter  Medications   Tdap (BOOSTRIX) 5-2.5-18.5 LF-MCG/0.5 injection    Sig: Inject 0.5 mLs into the muscle once for 1 dose.    Dispense:  0.5 mL    Refill:  0   Referral Orders  No referral(s) requested today     Electronically signed by: Napolean Backbone, DO [facility]

## 2024-03-28 ENCOUNTER — Other Ambulatory Visit: Payer: Self-pay | Admitting: Family Medicine

## 2024-03-28 ENCOUNTER — Encounter: Payer: Self-pay | Admitting: Family Medicine

## 2024-03-28 LAB — HEPATITIS C ANTIBODY: Hepatitis C Ab: NONREACTIVE

## 2024-03-28 MED ORDER — VITAMIN D3 50 MCG (2000 UT) PO CAPS: 2000.0000 [IU] | ORAL_CAPSULE | Freq: Every day | ORAL | 3 refills | Status: AC

## 2024-04-04 ENCOUNTER — Encounter: Payer: Self-pay | Admitting: Family Medicine

## 2024-04-04 ENCOUNTER — Ambulatory Visit
Admission: RE | Admit: 2024-04-04 | Discharge: 2024-04-04 | Disposition: A | Discharge status: Home / Self Care | Source: Ambulatory Visit | Attending: Family Medicine | Admitting: Family Medicine

## 2024-04-04 DIAGNOSIS — M8589 Other specified disorders of bone density and structure, multiple sites: Secondary | ICD-10-CM

## 2024-04-04 DIAGNOSIS — N958 Other specified menopausal and perimenopausal disorders: Secondary | ICD-10-CM | POA: Diagnosis not present

## 2024-04-04 DIAGNOSIS — M8588 Other specified disorders of bone density and structure, other site: Secondary | ICD-10-CM | POA: Diagnosis not present

## 2024-04-06 DIAGNOSIS — H52223 Regular astigmatism, bilateral: Secondary | ICD-10-CM | POA: Diagnosis not present

## 2024-04-06 DIAGNOSIS — H524 Presbyopia: Secondary | ICD-10-CM | POA: Diagnosis not present

## 2024-04-06 DIAGNOSIS — H5203 Hypermetropia, bilateral: Secondary | ICD-10-CM | POA: Diagnosis not present

## 2024-04-06 DIAGNOSIS — H2513 Age-related nuclear cataract, bilateral: Secondary | ICD-10-CM | POA: Diagnosis not present

## 2024-07-03 ENCOUNTER — Ambulatory Visit

## 2024-07-10 ENCOUNTER — Ambulatory Visit
Admission: RE | Admit: 2024-07-10 | Discharge: 2024-07-10 | Disposition: A | Discharge status: Home / Self Care | Source: Ambulatory Visit | Attending: Family Medicine | Admitting: Family Medicine

## 2024-07-10 DIAGNOSIS — Z1231 Encounter for screening mammogram for malignant neoplasm of breast: Secondary | ICD-10-CM | POA: Diagnosis not present

## 2024-07-13 ENCOUNTER — Other Ambulatory Visit: Payer: Self-pay | Admitting: Family Medicine

## 2024-07-13 DIAGNOSIS — R928 Other abnormal and inconclusive findings on diagnostic imaging of breast: Secondary | ICD-10-CM

## 2024-07-19 ENCOUNTER — Ambulatory Visit
Admission: RE | Admit: 2024-07-19 | Discharge: 2024-07-19 | Disposition: A | Discharge status: Home / Self Care | Source: Ambulatory Visit | Attending: Family Medicine | Admitting: Family Medicine

## 2024-07-19 ENCOUNTER — Ambulatory Visit

## 2024-07-19 DIAGNOSIS — R928 Other abnormal and inconclusive findings on diagnostic imaging of breast: Secondary | ICD-10-CM | POA: Diagnosis not present

## 2025-03-28 ENCOUNTER — Encounter

## 2025-03-28 ENCOUNTER — Encounter: Admitting: Family Medicine
